# Patient Record
Sex: Female | Born: 1955 | Race: White | Hispanic: No | Smoking: Never smoker
Health system: Southern US, Community
[De-identification: ages and names within clinical notes are randomized; demographics above are authoritative.]

## PROBLEM LIST (undated history)

## (undated) DIAGNOSIS — T7840XA Allergy, unspecified, initial encounter: Secondary | ICD-10-CM

## (undated) DIAGNOSIS — R011 Cardiac murmur, unspecified: Secondary | ICD-10-CM

## (undated) DIAGNOSIS — E785 Hyperlipidemia, unspecified: Secondary | ICD-10-CM

## (undated) HISTORY — DX: Cardiac murmur, unspecified: R01.1

## (undated) HISTORY — PX: BREAST BIOPSY: SHX20

## (undated) HISTORY — DX: Hyperlipidemia, unspecified: E78.5

## (undated) HISTORY — PX: COLONOSCOPY: SHX174

## (undated) HISTORY — DX: Allergy, unspecified, initial encounter: T78.40XA

---

## 1999-06-21 ENCOUNTER — Other Ambulatory Visit: Admission: RE | Admit: 1999-06-21 | Discharge: 1999-06-21 | Payer: Self-pay | Admitting: Obstetrics and Gynecology

## 2000-09-04 ENCOUNTER — Other Ambulatory Visit: Admission: RE | Admit: 2000-09-04 | Discharge: 2000-09-04 | Payer: Self-pay | Admitting: Obstetrics and Gynecology

## 2001-09-17 ENCOUNTER — Other Ambulatory Visit: Admission: RE | Admit: 2001-09-17 | Discharge: 2001-09-17 | Payer: Self-pay | Admitting: Obstetrics and Gynecology

## 2002-09-29 ENCOUNTER — Other Ambulatory Visit: Admission: RE | Admit: 2002-09-29 | Discharge: 2002-09-29 | Payer: Self-pay | Admitting: Obstetrics and Gynecology

## 2003-10-13 ENCOUNTER — Other Ambulatory Visit: Admission: RE | Admit: 2003-10-13 | Discharge: 2003-10-13 | Payer: Self-pay | Admitting: Obstetrics and Gynecology

## 2004-11-22 ENCOUNTER — Other Ambulatory Visit: Admission: RE | Admit: 2004-11-22 | Discharge: 2004-11-22 | Payer: Self-pay | Admitting: Obstetrics and Gynecology

## 2009-04-16 ENCOUNTER — Encounter (INDEPENDENT_AMBULATORY_CARE_PROVIDER_SITE_OTHER): Payer: Self-pay

## 2009-04-20 ENCOUNTER — Ambulatory Visit: Payer: Self-pay | Admitting: Internal Medicine

## 2009-05-03 ENCOUNTER — Ambulatory Visit: Payer: Self-pay | Admitting: Internal Medicine

## 2010-05-05 NOTE — Procedures (Signed)
Summary: Colonoscopy  Patient: [name] Note: All result statuses are Final unless otherwise noted.  Tests: (1) Colonoscopy (COL)   COL Colonoscopy           DONE     [facility]     [address]     [place], [place]  [address]           COLONOSCOPY PROCEDURE REPORT           PATIENT:  Melissa Montoya, Melissa Montoya  MR#:  [id number]     BIRTHDATE:  Aug 11, 1955, 53 yrs. old  GENDER:  female           ENDOSCOPIST:  Wilhemina Bonito. Eda Keys, MD     Referred by:  Richardean Chimera, M.D.           PROCEDURE DATE:  05/03/2009     PROCEDURE:  Average-risk screening colonoscopy     G0121     ASA CLASS:  Class I     INDICATIONS:  Routine Risk Screening           MEDICATIONS:   Fentanyl 75 mcg IV, Versed 9 mg IV           DESCRIPTION OF PROCEDURE:   After the risks benefits and     alternatives of the procedure were thoroughly explained, informed     consent was obtained.  Digital rectal exam was performed and     revealed no abnormalities.   The LB CF-[id number] endoscope     was introduced through the anus and advanced to the cecum, which     was identified by both the appendix and ileocecal valve, without     limitations.TIME TO CECUM = 4:22MIN.The quality of the prep was     excellent, using MoviPrep.  The instrument was then slowly     withdrawn (TIME = 10:37 MIN) as the colon was fully examined.     <<PROCEDUREIMAGES>>           FINDINGS:  A normal appearing cecum, ileocecal valve, and     appendiceal orifice were identified. The ascending, hepatic     flexure, transverse, splenic flexure, descending, sigmoid colon,     and rectum appeared unremarkable.  No polyps or cancers were seen.     Retroflexed views in the rectum revealed no abnormalities.    The     scope was then withdrawn from the patient and the procedure     completed.           COMPLICATIONS:  None           ENDOSCOPIC IMPRESSION:     1) Normal colon     2) No polyps or cancers     RECOMMENDATIONS:     1) Continue current  colorectal screening recommendations for     "routine risk" patients with a repeat colonoscopy in 10 years.           ______________________________     Wilhemina Bonito. Eda Keys, MD           CC:  Richardean Chimera, MD; The Patient           n.     eSIGNED:   Wilhemina Bonito. Eda Keys at 05/03/2009 09:34 AM           [place], Dondra Spry, [id number]  Note: An exclamation mark (!) indicates a result that was not dispersed into the flowsheet. Document Creation Date: 05/03/2009 9:34 AM _______________________________________________________________________  (1) Order result status:  Final Collection or observation date-time: 05/03/2009 09:27 Requested date-time:  Receipt date-time:  Reported date-time:  Referring Physician:   Ordering Physician: Fransico Setters [phone]) Specimen Source:  Source: Launa Grill Order Number: [id number] Lab site:   Appended Document: Colonoscopy    Clinical Lists Changes  Observations: Added new observation of COLONNXTDUE: 04/2019 (05/03/2009 12:28)

## 2010-05-05 NOTE — Miscellaneous (Signed)
Summary: Lec previsit  Clinical Lists Changes  Medications: Added new medication of MOVIPREP 100 GM  SOLR (PEG-KCL-NACL-NASULF-NA ASC-C) As per prep instructions. - Signed Rx of MOVIPREP 100 GM  SOLR (PEG-KCL-NACL-NASULF-NA ASC-C) As per prep instructions.;  #1 x 0;  Signed;  Entered by: Ulis Rias RN;  Authorized by: Hilarie Fredrickson MD;  Method used: Electronically to [facility] [phone]*, [address] [place], [place], [place]  [address], Ph: [phone], Fax: [phone] Observations: Added new observation of NKA: T (04/20/2009 7:56)    Prescriptions: MOVIPREP 100 GM  SOLR (PEG-KCL-NACL-NASULF-NA ASC-C) As per prep instructions.  #1 x 0   Entered by:   Ulis Rias RN   Authorized by:   Hilarie Fredrickson MD   Signed by:   Ulis Rias RN on 04/20/2009   Method used:   [facility]ronically to        [facility] [phone]* (retail)       [address]       [place], [place]  [address]       Ph: [phone]       Fax: [phone]   RxID:   [id number]

## 2010-05-05 NOTE — Letter (Signed)
Summary: [facility] Instructions  [facility]  [address] [place], [place] [address]   Phone: [phone]  Fax: [phone]       Melissa Montoya    September 13, 1955    MRN: [id number]        Procedure Day /Date:  Monday 04/05/2009     Arrival Time:  8:00 am      Procedure Time:  9:00 am     Location of Procedure:                    _x _  [facility] (4th Floor)                         PREPARATION FOR COLONOSCOPY WITH MOVIPREP   Starting 5 days prior to your procedure Wednesday 1/26 do not eat nuts, seeds, popcorn, corn, beans, peas,  salads, or any raw vegetables.  Do not take any fiber supplements (e.g. Metamucil, Citrucel, and Benefiber).  THE DAY BEFORE YOUR PROCEDURE         DATE: Sunday 1/30 1.  Drink clear liquids the entire day-NO SOLID FOOD  2.  Do not drink anything colored red or purple.  Avoid juices with pulp.  No orange juice.  3.  Drink at least 64 oz. (8 glasses) of fluid/clear liquids during the day to prevent dehydration and help the prep work efficiently.  CLEAR LIQUIDS INCLUDE: Water Jello Ice Popsicles Tea (sugar ok, no milk/cream) Powdered fruit flavored drinks Coffee (sugar ok, no milk/cream) Gatorade Juice: apple, white grape, white cranberry  Lemonade Clear bullion, consomm, broth Carbonated beverages (any kind) Strained chicken noodle soup Hard Candy                             4.  In the morning, mix first dose of MoviPrep solution:    Empty 1 Pouch A and 1 Pouch B into the disposable container    Add lukewarm drinking water to the top line of the container. Mix to dissolve    Refrigerate (mixed solution should be used within 24 hrs)  5.  Begin drinking the prep at 5:00 p.m. The MoviPrep container is divided by 4 marks.   Every 15 minutes drink the solution down to the next mark (approximately 8 oz) until the full liter is complete.   6.  Follow completed prep with 16 oz of clear liquid of your choice (Nothing red  or purple).  Continue to drink clear liquids until bedtime.  7.  Before going to bed, mix second dose of MoviPrep solution:    Empty 1 Pouch A and 1 Pouch B into the disposable container    Add lukewarm drinking water to the top line of the container. Mix to dissolve    Refrigerate  THE DAY OF YOUR PROCEDURE      DATE: Monday 1/31  Beginning at 4:00 am (5 hours before procedure):         1. Every 15 minutes, drink the solution down to the next mark (approx 8 oz) until the full liter is complete.  2. Follow completed prep with 16 oz. of clear liquid of your choice.    3. You may drink clear liquids until 7:00 am  (2 HOURS BEFORE PROCEDURE).   MEDICATION INSTRUCTIONS  Unless otherwise instructed, you should take regular prescription medications with a small sip of water   as early as possible  the morning of your procedure.          OTHER INSTRUCTIONS  You will need a responsible adult at least 53 years of age to accompany you and drive you home.   This person must remain in the waiting room during your procedure.  Wear loose fitting clothing that is easily removed.  Leave jewelry and other valuables at home.  However, you may wish to bring a book to read or  an iPod/MP3 player to listen to music as you wait for your procedure to start.  Remove all body piercing jewelry and leave at home.  Total time from sign-in until discharge is approximately 2-3 hours.  You should go home directly after your procedure and rest.  You can resume normal activities the  day after your procedure.  The day of your procedure you should not:   Drive   Make legal decisions   Operate machinery   Drink alcohol   Return to work  You will receive specific instructions about eating, activities and medications before you leave.    The above instructions have been reviewed and explained to me by   Ulis Rias RN  April 20, 2009 8:08 AM     I fully understand and can verbalize  these instructions _____________________________ Date _________

## 2015-01-17 ENCOUNTER — Emergency Department (HOSPITAL_BASED_OUTPATIENT_CLINIC_OR_DEPARTMENT_OTHER)
Admission: EM | Admit: 2015-01-17 | Discharge: 2015-01-17 | Disposition: A | Discharge status: Home / Self Care | Payer: 59 | Attending: Emergency Medicine | Admitting: Emergency Medicine

## 2015-01-17 ENCOUNTER — Encounter (HOSPITAL_BASED_OUTPATIENT_CLINIC_OR_DEPARTMENT_OTHER): Payer: Self-pay | Admitting: Emergency Medicine

## 2015-01-17 DIAGNOSIS — Y9389 Activity, other specified: Secondary | ICD-10-CM | POA: Diagnosis not present

## 2015-01-17 DIAGNOSIS — L03114 Cellulitis of left upper limb: Secondary | ICD-10-CM | POA: Diagnosis not present

## 2015-01-17 DIAGNOSIS — W5501XA Bitten by cat, initial encounter: Secondary | ICD-10-CM | POA: Insufficient documentation

## 2015-01-17 DIAGNOSIS — S61432A Puncture wound without foreign body of left hand, initial encounter: Secondary | ICD-10-CM | POA: Insufficient documentation

## 2015-01-17 DIAGNOSIS — Y998 Other external cause status: Secondary | ICD-10-CM | POA: Insufficient documentation

## 2015-01-17 DIAGNOSIS — Y92094 Garage of other non-institutional residence as the place of occurrence of the external cause: Secondary | ICD-10-CM | POA: Insufficient documentation

## 2015-01-17 DIAGNOSIS — Z23 Encounter for immunization: Secondary | ICD-10-CM | POA: Insufficient documentation

## 2015-01-17 DIAGNOSIS — S61452A Open bite of left hand, initial encounter: Secondary | ICD-10-CM

## 2015-01-17 MED ORDER — RABIES IMMUNE GLOBULIN 150 UNIT/ML IM INJ: 20.0000 [IU]/kg | INJECTION | Freq: Once | INTRAMUSCULAR | Status: DC

## 2015-01-17 MED ORDER — AMOXICILLIN-POT CLAVULANATE 875-125 MG PO TABS: 1.0000 | ORAL_TABLET | Freq: Two times a day (BID) | ORAL | Status: DC

## 2015-01-17 MED ORDER — LIDOCAINE-EPINEPHRINE-TETRACAINE (LET) SOLUTION: 3.0000 mL | Freq: Once | NASAL | Status: DC

## 2015-01-17 MED ORDER — TETANUS-DIPHTH-ACELL PERTUSSIS 5-2.5-18.5 LF-MCG/0.5 IM SUSP
0.5000 mL | Freq: Once | INTRAMUSCULAR | Status: AC
  Administered 2015-01-17: 0.5 mL via INTRAMUSCULAR
  Filled 2015-01-17: qty 0.5

## 2015-01-17 MED ORDER — RABIES VACCINE, PCEC IM SUSR: 1.0000 mL | Freq: Once | INTRAMUSCULAR | Status: DC

## 2015-01-17 MED ORDER — AMOXICILLIN-POT CLAVULANATE 875-125 MG PO TABS
1.0000 | ORAL_TABLET | Freq: Once | ORAL | Status: AC
  Administered 2015-01-17: 1 via ORAL
  Filled 2015-01-17: qty 1

## 2015-01-17 NOTE — ED Provider Notes (Signed)
CSN: [id number]     Arrival date & time 01/17/15  1646 History   First MD Initiated Contact with Patient 01/17/15 1657     Chief Complaint  Patient presents with  . Animal Bite  . Cellulitis     (Consider location/radiation/quality/duration/timing/severity/associated sxs/prior Treatment) HPI   Blood pressure 121/76, pulse 70, temperature 98.1 F (36.7 C), temperature source Oral, resp. rate 20, height 5\' 6"  (1.676 m), weight 127 lb (57.607 kg), SpO2 99 %.  Melissa Montoya is a 59 y.o. female complaining of pain and swelling to left hand after being bitten by stray cat yesterday. Patient states she was in her garage, she had around cats with her straight Came in initially the cath was eating friendly, she tried to separate her pets from the stray of the cat bit her. She has increasing pain and swelling to the left hand onset this morning. She rates it at 5 out of 10. She denies fever, chills, nausea, vomiting. Last tetanus shot is unknown. Cat is unavailable for testing. Patient states that the was not acting abnormally aggressive.   History reviewed. No pertinent past medical history. History reviewed. No pertinent past surgical history. History reviewed. No pertinent family history. Social History  Substance Use Topics  . Smoking status: Never Smoker   . Smokeless tobacco: None  . Alcohol Use: Yes   OB History    No data available     Review of Systems  10 systems reviewed and found to be negative, except as noted in the HPI.  Allergies  Review of patient's allergies indicates no known allergies.  Home Medications   Prior to Admission medications   Not on File   BP 121/76 mmHg  Pulse 70  Temp(Src) 98.1 F (36.7 C) (Oral)  Resp 20  Ht 5\' 6"  (1.676 m)  Wt 127 lb (57.607 kg)  BMI 20.51 kg/m2  SpO2 99% Physical Exam  Constitutional: She is oriented to person, place, and time. She appears well-developed and well-nourished. No distress.  HENT:  Head: Normocephalic.    Eyes: Conjunctivae and EOM are normal.  Cardiovascular: Normal rate.   Pulmonary/Chest: Effort normal. No stridor.  Musculoskeletal: Normal range of motion.  Neurological: She is alert and oriented to person, place, and time.  Skin:  Swelling and area of cellulitis to left hand thenar eminence. Patient has puncture wounds to dorsum and volar side consistent with bite. Neurovascularly intact.  Psychiatric: She has a normal mood and affect.  Nursing note and vitals reviewed.           ED Course  Procedures (including critical care time) Labs Review Labs Reviewed - No data to display  Imaging Review No results found. I have personally reviewed and evaluated these images and lab results as part of my medical decision-making.   EKG Interpretation None      MDM   Final diagnoses:  Cat bite of hand, left, initial encounter  Cellulitis of left upper extremity    Filed Vitals:   01/17/15 1652 01/17/15 1654 01/17/15 1756  BP:  121/76 144/77  Pulse: 70  70  Temp: 98.1 F (36.7 C)  97.9 F (36.6 C)  TempSrc: Oral  Oral  Resp: 20  18  Height: 5\' 6"  (1.676 m)    Weight: 127 lb (57.607 kg)    SpO2: 99%  100%    Medications  Tdap (BOOSTRIX) injection 0.5 mL (0.5 mLs Intramuscular Given 01/17/15 1710)  amoxicillin-clavulanate (AUGMENTIN) 875-125 MG per tablet 1 tablet (1  tablet Oral Given 01/17/15 1709)    [name] is 59 y.o. female presenting with puncture wound and cellulitis secondary to Bite occurring yesterday. Tetanus is updated and patient is started on Augmentin. Recommend rabies vaccination as animals not available for testing. Patient declines and states she will try to locate the animal. Patient given animal control contact information. Advised her were to return to the ED tomorrow for recheck. Area of cellulitis is outlined we've had an extensive discussion of return precautions. Have advised this patient if she changes her mind about the rabies vaccination  she is welcome to return to the ED.    Evaluation does not show pathology that would require ongoing emergent intervention or inpatient treatment. Pt is hemodynamically stable and mentating appropriately. Discussed findings and plan with patient/guardian, who agrees with care plan. All questions answered. Return precautions discussed and outpatient follow up given.   Discharge Medication List as of 01/17/2015  6:14 PM    START taking these medications   Details  amoxicillin-clavulanate (AUGMENTIN) 875-125 MG tablet Take 1 tablet by mouth 2 (two) times daily. One tab po bid x 10 days, Starting 01/17/2015, Until Discontinued, Print           Monico Blitz, PA-C 01/18/15 0010  Ezequiel Essex, MD 01/18/15 Laureen Abrahams

## 2015-01-17 NOTE — ED Notes (Signed)
Wound erythema marked with skin marker

## 2015-01-17 NOTE — Discharge Instructions (Signed)
Please return to the emergency room or your primary care physician for a wound recheck in 24 hours. If you cannot find the cat please return to [name] to begin rabies vaccination series  Please follow with your primary care doctor in the next 2 days for a check-up. They must obtain records for further management.   Do not hesitate to return to the Emergency Department for any new, worsening or concerning symptoms.    Cellulitis Cellulitis is an infection of the skin and the tissue beneath it. The infected area is usually red and tender. Cellulitis occurs most often in the arms and lower legs.  CAUSES  Cellulitis is caused by bacteria that enter the skin through cracks or cuts in the skin. The most common types of bacteria that cause cellulitis are staphylococci and streptococci. SIGNS AND SYMPTOMS   Redness and warmth.  Swelling.  Tenderness or pain.  Fever. DIAGNOSIS  Your health care provider can usually determine what is wrong based on a physical exam. Blood tests may also be done. TREATMENT  Treatment usually involves taking an antibiotic medicine. HOME CARE INSTRUCTIONS   Take your antibiotic medicine as directed by your health care provider. Finish the antibiotic even if you start to feel better.  Keep the infected arm or leg elevated to reduce swelling.  Apply a warm cloth to the affected area up to 4 times per day to relieve pain.  Take medicines only as directed by your health care provider.  Keep all follow-up visits as directed by your health care provider. SEEK MEDICAL CARE IF:   You notice red streaks coming from the infected area.  Your red area gets larger or turns dark in color.  Your bone or joint underneath the infected area becomes painful after the skin has healed.  Your infection returns in the same area or another area.  You notice a swollen bump in the infected area.  You develop new symptoms.  You have a fever. SEEK  IMMEDIATE MEDICAL CARE IF:   You feel very sleepy.  You develop vomiting or diarrhea.  You have a general ill feeling (malaise) with muscle aches and pains.   This information is not intended to replace advice given to you by your health care provider. Make sure you discuss any questions you have with your health care provider.   Document Released: 12/28/2004 Document Revised: 12/09/2014 Document Reviewed: 06/05/2011 Elsevier Interactive Patient Education [employer].

## 2015-01-17 NOTE — ED Notes (Signed)
PA at bedside.

## 2015-01-17 NOTE — ED Notes (Signed)
Pt in c/o cat bite to L hand yesterday, pt now has edema, erythema, and singular red streak noted to be travelling proximally up the L forearm.

## 2015-01-17 NOTE — ED Notes (Signed)
EDPA Elmyra Ricks discussed recommendation for rabies vaccine with pt. Pt wishes to attempt to locate cat and follow up with [name] if she decides to have to rabies series. Advised pt to contact animal control to assist her with finding the cat

## 2015-01-18 ENCOUNTER — Encounter (HOSPITAL_BASED_OUTPATIENT_CLINIC_OR_DEPARTMENT_OTHER): Payer: Self-pay | Admitting: Emergency Medicine

## 2015-01-18 ENCOUNTER — Emergency Department (HOSPITAL_BASED_OUTPATIENT_CLINIC_OR_DEPARTMENT_OTHER)
Admission: EM | Admit: 2015-01-18 | Discharge: 2015-01-18 | Disposition: A | Discharge status: Home / Self Care | Payer: 59 | Attending: Emergency Medicine | Admitting: Emergency Medicine

## 2015-01-18 DIAGNOSIS — W5501XD Bitten by cat, subsequent encounter: Secondary | ICD-10-CM | POA: Insufficient documentation

## 2015-01-18 DIAGNOSIS — Z4800 Encounter for change or removal of nonsurgical wound dressing: Secondary | ICD-10-CM | POA: Diagnosis present

## 2015-01-18 DIAGNOSIS — S61452D Open bite of left hand, subsequent encounter: Secondary | ICD-10-CM

## 2015-01-18 NOTE — Discharge Instructions (Signed)
Return here as needed.  Follow-up with your primary doctor.  Keep area clean and dry

## 2015-01-18 NOTE — ED Notes (Signed)
Patient reports that her redness is looking a lot better. Skin marker edges intact no redness out side of the sites

## 2015-01-18 NOTE — ED Provider Notes (Signed)
CSN: [id number]     Arrival date & time 01/18/15  1645 History   First MD Initiated Contact with Patient 01/18/15 1853     Chief Complaint  Patient presents with  . Wound Check     (Consider location/radiation/quality/duration/timing/severity/associated sxs/prior Treatment) HPI Patient presents to the emergency department with a recheck of a cat bite to her hand.  The patient was seen yesterday and advised to return here for recheck.  The area was outlined that was previously erythematous.  Patient states the swelling and redness seemed to improved.  She has not noticed any drainage from the bite History reviewed. No pertinent past medical history. History reviewed. No pertinent past surgical history. History reviewed. No pertinent family history. Social History  Substance Use Topics  . Smoking status: Never Smoker   . Smokeless tobacco: None  . Alcohol Use: Yes   OB History    No data available     Review of Systems  All other systems negative except as documented in the HPI. All pertinent positives and negatives as reviewed in the HPI.  Allergies  Review of patient's allergies indicates no known allergies.  Home Medications   Prior to Admission medications   Medication Sig Start Date End Date Taking? Authorizing Provider  amoxicillin-clavulanate (AUGMENTIN) 875-125 MG tablet Take 1 tablet by mouth 2 (two) times daily. One tab po bid x 10 days 01/17/15   Elmyra Ricks Pisciotta, PA-C   BP 125/72 mmHg  Pulse 64  Temp(Src) 98.3 F (36.8 C) (Oral)  Resp 16  Ht 5\' 6"  (1.676 m)  Wt 127 lb (57.607 kg)  BMI 20.51 kg/m2  SpO2 100% Physical Exam  Constitutional: She appears well-developed and well-nourished. No distress.  HENT:  Head: Normocephalic and atraumatic.  Eyes: Pupils are equal, round, and reactive to light.  Pulmonary/Chest: Effort normal.  Skin:       ED Course  Procedures (including critical care time) Labs Review Labs Reviewed - No data to  display  Imaging Review No results found. I have personally reviewed and evaluated these images and lab results as part of my medical decision-making. Based on the patient's report of decreased swelling and redness along with the appearance of the area.  There is no signs of significant deep space infection of the hand.  Patient is advised to monitor the area.  Keep the area clean and dry.  Told to return here as needed    Dalia Heading, PA-C 14/48/18 5631  David Glick, MD 49/70/26 [id number]

## 2015-01-19 ENCOUNTER — Emergency Department (HOSPITAL_COMMUNITY)
Admission: EM | Admit: 2015-01-19 | Discharge: 2015-01-19 | Disposition: A | Discharge status: Home / Self Care | Payer: 59 | Attending: Emergency Medicine | Admitting: Emergency Medicine

## 2015-01-19 ENCOUNTER — Encounter (HOSPITAL_COMMUNITY): Payer: Self-pay

## 2015-01-19 DIAGNOSIS — S61432D Puncture wound without foreign body of left hand, subsequent encounter: Secondary | ICD-10-CM | POA: Diagnosis not present

## 2015-01-19 DIAGNOSIS — W5501XD Bitten by cat, subsequent encounter: Secondary | ICD-10-CM | POA: Insufficient documentation

## 2015-01-19 DIAGNOSIS — Z23 Encounter for immunization: Secondary | ICD-10-CM | POA: Insufficient documentation

## 2015-01-19 MED ORDER — RABIES VACCINE, PCEC IM SUSR
1.0000 mL | Freq: Once | INTRAMUSCULAR | Status: AC
  Administered 2015-01-19: 1 mL via INTRAMUSCULAR
  Filled 2015-01-19: qty 1

## 2015-01-19 MED ORDER — RABIES IMMUNE GLOBULIN 150 UNIT/ML IM INJ
20.0000 [IU]/kg | INJECTION | Freq: Once | INTRAMUSCULAR | Status: AC
  Administered 2015-01-19: 1125 [IU]
  Filled 2015-01-19: qty 8

## 2015-01-19 NOTE — ED Notes (Signed)
Pt reports being bit by outdoor cat on Saturday. Pt had infection to area and is currently being tx with amoxicillin. Infection improving but pt states she found out cat hasn't been vaccinated. Pt requesting rabies vaccination.

## 2015-01-19 NOTE — Discharge Instructions (Signed)
Rabies °Rabies is a viral infection that can be spread to people from infected animals. The infection affects the brain and central nervous system. Once the disease develops, it almost always causes death. Because of this, when a person is bitten by an animal that may have rabies, treatment to prevent rabies often needs to be started whether or not the animal is known to be infected. Prompt treatment with the rabies vaccine and rabies immune globulin is very effective at preventing the infection from developing in people who have been exposed to the rabies virus. °CAUSES  °Rabies is caused by a virus that lives inside some animals. When a person is bitten by an infected animal, the rabies virus is spread to the person through the infected spit (saliva) of the animal. This virus can be carried by animals such as dogs, cats, skunks, bats, woodchucks, raccoons, coyotes, and foxes. °SYMPTOMS  °By the time symptoms appear, rabies is usually fatal for the person. Common symptoms include: °· Headache. °· Fever. °· Fatigue and weakness. °· Agitation. °· Anxiety. °· Confusion. °· Unusual behavior, such as hyperactivity, fear of water (hydrophobia), or fear of air (aerophobia). °· Hallucinations. °· Insomnia. °· Weakness in the arms or legs. °· Difficulty swallowing. °Most people get sick in 1-3 months after being bitten. This often varies and may depend on the location of the bite. The infection will take less time to develop if the bite occurred closer to the head.  °DIAGNOSIS  °To determine if a person is infected, several tests must be performed, such as: °· A skin biopsy. °· A saliva test. °· A lumbar puncture to remove spinal fluid so it can be examined. °· Blood tests. °TREATMENT  °Treatment to prevent the infection from developing (post-exposure prophylaxis, PEP) is often started before knowing for sure if the person has been exposed to the rabies virus. PEP involves cleaning the wound, giving an antibody injection  (rabies immune globulin), and giving a series of rabies vaccine injections. The series of injections are usually given over a two-week period. If possible, the animal that bit the person will be observed to see if it remains healthy. If the animal has been killed, it can be sent to a state laboratory and examined to see if the animal had rabies. °If a person is bitten by a domestic animal (dog, cat, or ferret) that appears healthy and can be observed to see if it remains healthy, often no further treatment is necessary other than care of the wounds caused by the animal. °Rabies is often a fatal illness once the infection develops in a person. Although a few people who developed rabies have survived after experimental treatment with certain drugs, all these survivors still had severe nervous system problems after the treatment. This is why caregivers use extra caution and begin PEP treatment for people who have been bitten by animals that are possibly infected with rabies.  °HOME CARE INSTRUCTIONS  °If you were bitten by an unknown animal, make sure you know your caregiver's instructions for follow-up. If the animal was sent to a laboratory for examination, ask when the test results will be ready. Make sure you get the test results.  °Take these steps to care for your wound: °· Keep the wound clean, dry, and dressed as directed by your caregiver. °· Keep the injured part elevated as much as possible. °· Do not resume use of the affected area until directed. °· Only take over-the-counter or prescription medicines as directed by your   caregiver. °· Keep all follow-up appointments as directed by your caregiver. °PREVENTION  °To prevent rabies, people need to reduce their risk of having contact with infected animals.  °· Make sure your pets (dogs, cats, ferrets) are vaccinated against rabies. Keep these vaccinations up-to-date as directed by your veterinarian. °· Supervise your pets when they are outside. Keep them away  from wild animals. °· Call your local animal control services to report any stray animals. These animals may not be vaccinated. °· Stay away from stray or wild animals. °· Consider getting the rabies vaccine (preexposure) if you are traveling to an area where rabies is common or if your job or activities involve possible contact with wild or stray animals. Discuss this with your caregiver. °  °This information is not intended to replace advice given to you by your health care provider. Make sure you discuss any questions you have with your health care provider. °  °Document Released: 03/20/2005 Document Revised: 04/10/2014 Document Reviewed: 10/17/2011 °Elsevier Interactive Patient Education ©2016 Elsevier Inc. ° °

## 2015-01-19 NOTE — ED Notes (Signed)
Rabies sheet explained to pt and faxed to [facility] and pharmacy.

## 2015-01-22 ENCOUNTER — Telehealth: Payer: Self-pay

## 2015-01-22 ENCOUNTER — Emergency Department (HOSPITAL_COMMUNITY)
Admission: EM | Admit: 2015-01-22 | Discharge: 2015-01-22 | Disposition: A | Discharge status: Home / Self Care | Payer: 59 | Source: Home / Self Care

## 2015-01-22 ENCOUNTER — Encounter (HOSPITAL_COMMUNITY): Payer: Self-pay | Admitting: *Deleted

## 2015-01-22 DIAGNOSIS — Z203 Contact with and (suspected) exposure to rabies: Secondary | ICD-10-CM

## 2015-01-22 MED ORDER — RABIES VACCINE, PCEC IM SUSR
INTRAMUSCULAR | Status: AC
  Filled 2015-01-22: qty 1

## 2015-01-22 MED ORDER — RABIES VACCINE, PCEC IM SUSR
1.0000 mL | Freq: Once | INTRAMUSCULAR | Status: AC
  Administered 2015-01-22: 1 mL via INTRAMUSCULAR

## 2015-01-22 NOTE — ED Notes (Signed)
Pt  Here  For  The  Next  In rabies   Shots     She  Reports  The  Bite  Is  Much better       She  Is  Taking  Her  Anti   Biotics

## 2015-01-22 NOTE — Discharge Instructions (Signed)
Return  As  Directed    For  The next  In  Series      Sooner  If  worse

## 2015-01-22 NOTE — Telephone Encounter (Signed)
error 

## 2015-01-26 ENCOUNTER — Emergency Department (HOSPITAL_COMMUNITY)
Admission: EM | Admit: 2015-01-26 | Discharge: 2015-01-26 | Disposition: A | Discharge status: Home / Self Care | Payer: 59 | Source: Home / Self Care

## 2015-01-26 ENCOUNTER — Encounter (HOSPITAL_COMMUNITY): Payer: Self-pay

## 2015-01-26 DIAGNOSIS — Z203 Contact with and (suspected) exposure to rabies: Secondary | ICD-10-CM

## 2015-01-26 MED ORDER — RABIES VACCINE, PCEC IM SUSR
1.0000 mL | Freq: Once | INTRAMUSCULAR | Status: AC
  Administered 2015-01-26: 1 mL via INTRAMUSCULAR

## 2015-01-26 MED ORDER — RABIES VACCINE, PCEC IM SUSR
INTRAMUSCULAR | Status: AC
  Filled 2015-01-26: qty 1

## 2015-01-26 NOTE — ED Notes (Addendum)
Day #7 , shot #3 of rabies shots, started at [facility]; denies issues w bite site

## 2015-02-02 ENCOUNTER — Encounter (HOSPITAL_COMMUNITY): Payer: Self-pay

## 2015-02-02 ENCOUNTER — Emergency Department (HOSPITAL_COMMUNITY)
Admission: EM | Admit: 2015-02-02 | Discharge: 2015-02-02 | Disposition: A | Discharge status: Home / Self Care | Payer: 59 | Source: Home / Self Care

## 2015-02-02 DIAGNOSIS — Z203 Contact with and (suspected) exposure to rabies: Secondary | ICD-10-CM

## 2015-02-02 MED ORDER — RABIES VACCINE, PCEC IM SUSR
1.0000 mL | Freq: Once | INTRAMUSCULAR | Status: AC
  Administered 2015-02-02: 1 mL via INTRAMUSCULAR

## 2015-02-02 MED ORDER — RABIES VACCINE, PCEC IM SUSR
INTRAMUSCULAR | Status: AC
  Filled 2015-02-02: qty 1

## 2015-02-02 NOTE — ED Notes (Signed)
Day #14 rabies series

## 2015-02-25 NOTE — ED Provider Notes (Signed)
CSN: [id number]     Arrival date & time 01/19/15  1214 History   First MD Initiated Contact with Patient 01/19/15 1309     Chief Complaint  Patient presents with  . Rabies Injection     (Consider location/radiation/quality/duration/timing/severity/associated sxs/prior Treatment) The history is provided by the patient. No language interpreter was used.  Patient present to the ED with concern for rabies prophylaxis. She has been seen twice for cat bite to the hand which is improving since her initial visit. She spoke with the cat's primary owner and the cat is not UTD on rabies. She is requesting rabies prophylaxis.   History reviewed. No pertinent past medical history. History reviewed. No pertinent past surgical history. No family history on file. Social History  Substance Use Topics  . Smoking status: Never Smoker   . Smokeless tobacco: None  . Alcohol Use: Yes   OB History    No data available     Review of Systems Ten systems reviewed and are negative for acute change, except as noted in the HPI.     Allergies  Review of patient's allergies indicates no known allergies.  Home Medications   Prior to Admission medications   Medication Sig Start Date End Date Taking? Authorizing Provider  amoxicillin-clavulanate (AUGMENTIN) 875-125 MG tablet Take 1 tablet by mouth 2 (two) times daily. One tab po bid x 10 days 01/17/15   Elmyra Ricks Pisciotta, PA-C   BP 121/84 mmHg  Pulse 82  Temp(Src) 98.4 F (36.9 C) (Oral)  Resp 14  Ht 5' 6.5" (1.689 m)  Wt 57.607 kg  BMI 20.19 kg/m2  SpO2 100% Physical Exam  Constitutional: She is oriented to person, place, and time. She appears well-developed and well-nourished. No distress.  HENT:  Head: Normocephalic and atraumatic.  Eyes: Conjunctivae are normal. No scleral icterus.  Neck: Normal range of motion.  Cardiovascular: Normal rate, regular rhythm and normal heart sounds.  Exam reveals no gallop and no friction rub.   No murmur  heard. Pulmonary/Chest: Effort normal and breath sounds normal. No respiratory distress.  Abdominal: Soft. Bowel sounds are normal. She exhibits no distension and no mass. There is no tenderness. There is no guarding.  Musculoskeletal:  2 small punctures on the thenar eminence of the left hand. Erythema is nearly resolved away from the line of demarcation.. Minimal tenderness  Neurological: She is alert and oriented to person, place, and time.  Skin: Skin is warm and dry. She is not diaphoretic.  Nursing note and vitals reviewed.   ED Course  Procedures (including critical care time) Labs Review Labs Reviewed - No data to display  Imaging Review No results found. I have personally reviewed and evaluated these images and lab results as part of my medical decision-making.   EKG Interpretation None      MDM   Final diagnoses:  Need for rabies vaccination    Patient wound welll healing. Rabies Immunoglobulin and vaccination given. No immediate reaction  Follow up for rabies series at [facility]. contine with augmentin.  Margarita Mail, PA-C 02/25/15 [place], DO 02/25/15 1406

## 2015-04-16 ENCOUNTER — Encounter: Payer: Self-pay | Admitting: Internal Medicine

## 2016-03-04 ENCOUNTER — Encounter (HOSPITAL_BASED_OUTPATIENT_CLINIC_OR_DEPARTMENT_OTHER): Payer: Self-pay | Admitting: *Deleted

## 2016-03-04 ENCOUNTER — Emergency Department (HOSPITAL_BASED_OUTPATIENT_CLINIC_OR_DEPARTMENT_OTHER)
Admission: EM | Admit: 2016-03-04 | Discharge: 2016-03-04 | Disposition: A | Discharge status: Home / Self Care | Payer: 59 | Attending: Emergency Medicine | Admitting: Emergency Medicine

## 2016-03-04 DIAGNOSIS — L03116 Cellulitis of left lower limb: Secondary | ICD-10-CM

## 2016-03-04 DIAGNOSIS — M25572 Pain in left ankle and joints of left foot: Secondary | ICD-10-CM | POA: Diagnosis present

## 2016-03-04 MED ORDER — IBUPROFEN 200 MG PO TABS
600.0000 mg | ORAL_TABLET | Freq: Once | ORAL | Status: AC
  Administered 2016-03-04: 600 mg via ORAL

## 2016-03-04 MED ORDER — CEPHALEXIN 500 MG PO CAPS: 500.0000 mg | ORAL_CAPSULE | Freq: Four times a day (QID) | ORAL | 0 refills | Status: DC

## 2016-03-04 NOTE — Discharge Instructions (Signed)
You exam is consistent with an infection of the skin (cellulitis). I have prescribed you Keflex to take 4 times per day for 7 days. If your symptoms fail to improve or worsen, please seek care. You can take Ibuprofen or Tylenol for the pain. I suspect your dry skin may have contributed to the infection, try to use a moisturizer regularly.

## 2016-03-04 NOTE — ED Provider Notes (Signed)
[facility] DEPT [facility] Provider Note   CSN: [id number] Arrival date & time: 03/04/16  [id number]     History   Chief Complaint Chief Complaint  Patient presents with  . Ankle Pain    HPI Melissa Montoya is a 60 y.o. female.  HPI  Patient noted left swollen and erythematous ankle yesterday. She notes the pain is in the anterior aspect. She feels it started like shin splints a few days prior but became more concerning once there was swelling and erythema.   She notes pain with ambulation that started today which prompted her to come in.  No fevers, chills, SOB, no long trips, h/o malignancy, prolonged immbolization. Not on any medications.   She's in the process of moving residences since Monday. She doesn't recall an injury. She's never injured the ankle before.  She used iced and elevation with helped some.   PCP- [facility]  History reviewed. No pertinent past medical history.  There are no active problems to display for this patient.   History reviewed. No pertinent surgical history.  OB History    No data available       Home Medications    Prior to Admission medications   Medication Sig Start Date End Date Taking? Authorizing Provider  Fexofenadine HCl (ALLEGRA ALLERGY PO) Take by mouth.   Yes Historical Provider, MD  cephALEXin (KEFLEX) 500 MG capsule Take 1 capsule (500 mg total) by mouth 4 (four) times daily. 03/04/16   Archie Patten, MD    Family History No family history on file.  Social History Social History  Substance Use Topics  . Smoking status: Never Smoker  . Smokeless tobacco: Never Used  . Alcohol use Yes     Comment: daily     Allergies   Patient has no known allergies.   Review of Systems Review of Systems  Constitutional: Positive for activity change. Negative for appetite change, chills, diaphoresis, fatigue and fever.  HENT: Negative.   Eyes: Negative.   Respiratory: Negative for cough, chest tightness, shortness of breath,  wheezing and stridor.   Cardiovascular: Negative for chest pain.  Gastrointestinal: Negative.   Endocrine: Negative.   Genitourinary: Negative.   Musculoskeletal: Positive for arthralgias, gait problem and joint swelling.  Allergic/Immunologic: Negative.   Neurological: Negative for weakness.  Hematological: Negative.   Psychiatric/Behavioral: Negative.      Physical Exam Updated Vital Signs BP 124/71 (BP Location: Right Arm)   Pulse 80   Temp 98.2 F (36.8 C) (Oral)   Resp 18   Ht 5\' 6"  (1.676 m)   Wt 57.6 kg   SpO2 98%   BMI 20.50 kg/m   Physical Exam  Constitutional: She is oriented to person, place, and time. She appears well-developed and well-nourished. No distress.  HENT:  Head: Normocephalic and atraumatic.  Nose: Nose normal.  Mouth/Throat: No oropharyngeal exudate.  Eyes: Conjunctivae are normal. Right eye exhibits no discharge. Left eye exhibits no discharge. No scleral icterus.  Neck: Neck supple.  Cardiovascular: Normal rate, regular rhythm and intact distal pulses.  Exam reveals no gallop and no friction rub.   No murmur heard. Pulmonary/Chest: Effort normal. No respiratory distress. She has no wheezes. She has no rales. She exhibits no tenderness.  Abdominal: Soft. She exhibits no distension.  Musculoskeletal:  Left leg: ankle swollen and erythematous. Erythema and warmth from the ankle up to mid-shin on the anterior aspect. Tenderness to palpation over the anterior shin diffusely, no point tenderness. No drainage or fluctuance noted.  No tenderness over the medial or lateral malleoli. With Homnan's maneuver, pt endorses pain over the anterior aspect of the leg but none in the posterior calf. Pain with both dorsiflexion and plantar flexion. Sensation intact distally. Brisk capillary refill. Equal DP pulses bilaterally On measuring calves, 33cm on the right, 34cm on the left.   Neurological: She is alert and oriented to person, place, and time.  Skin: Skin is  dry. Capillary refill takes less than 2 seconds. She is not diaphoretic. There is erythema.  Warmth and erythema of the left anterior lower extremity as above.  Psychiatric: She has a normal mood and affect.     ED Treatments / Results  Labs (all labs ordered are listed, but only abnormal results are displayed) Labs Reviewed - No data to display  EKG  EKG Interpretation None       Radiology No results found.  Procedures Procedures (including critical care time)  Medications Ordered in ED Medications  ibuprofen (ADVIL,MOTRIN) tablet 600 mg (600 mg Oral Given 03/04/16 0956)     Initial Impression / Assessment and Plan / ED Course  I have reviewed the triage vital signs and the nursing notes.  Pertinent labs & imaging results that were available during my care of the patient were reviewed by me and considered in my medical decision making (see chart for details).  Clinical Course     Final Clinical Impressions(s) / ED Diagnoses   Final diagnoses:  Cellulitis of left lower extremity   This is a previously healthy 60 year old female presenting with pain, swelling, and erythema of the left lower extremity. Exam consistent with cellulitis without purulence. She is well-appearing on exam without systemic symptoms.  Well's criteria for DVT -2 (as an alternative diagnosis is more likely); she is not tachycardic and has no risk factors. Most likely fracture given the lack of point tenderness and the presence of erythema and warmth of the lower external knee. The patient was given a Rx for Keflex 500mg  4x/day and return precautions were discussed. She is stable for discharge and in agreement with the plan.  New Prescriptions New Prescriptions   CEPHALEXIN (KEFLEX) 500 MG CAPSULE    Take 1 capsule (500 mg total) by mouth 4 (four) times daily.     Archie Patten, MD 03/04/16 [place] Liu, MD 03/04/16 2220

## 2016-03-04 NOTE — ED Triage Notes (Signed)
Pt reports L ankle pain and swelling that began yesterday. Reports she's been moving her residence (carrying heaving boxes, walking up and down stairs, etc) this past week. Reports elevation and ice helps pain. Denies numbness/tingling. Able to bear weight on foot.

## 2016-03-04 NOTE — ED Provider Notes (Signed)
I saw and evaluated the patient, reviewed the resident's note and I agree with the findings and plan.   EKG Interpretation None      60 year old female, otherwise healthy, presenting with swelling and redness to right lower leg. In process of moving homes, and has been on her feet more, carrying boxes, packing boxes. No trauma or known injury, but w/ dry skin to lower extremities. Since yesterday, swelling and erythema to the anterior ankle/shin of the left leg. No numbness or weakness. Able to ambulate, but pain worsens with walking. No fever or chills, calf tenderness, dyspnea, chest pain.  Presentation consistent with cellulitis of the left lower anterior shin, with tenderness to palpation, warmth, overlying erythema. Normal ROM of of ankle and no significant ankle swelling. No concerns for septic arthritis. No systemic signs or symptoms of illness. Extremities neurovascularly in tact.  Will treat with course of kelfex and discussed continued supportive care instructions. Strict return and follow-up instructions reviewed. She expressed understanding of all discharge instructions and felt comfortable with the plan of care.    Forde Dandy, MD 03/04/16 1001

## 2016-03-09 ENCOUNTER — Ambulatory Visit
Admission: RE | Admit: 2016-03-09 | Discharge: 2016-03-09 | Disposition: A | Discharge status: Home / Self Care | Payer: 59 | Source: Ambulatory Visit | Attending: Internal Medicine | Admitting: Internal Medicine

## 2016-03-09 ENCOUNTER — Other Ambulatory Visit: Payer: Self-pay | Admitting: Internal Medicine

## 2016-03-09 DIAGNOSIS — L03116 Cellulitis of left lower limb: Secondary | ICD-10-CM

## 2016-03-09 DIAGNOSIS — R600 Localized edema: Secondary | ICD-10-CM

## 2017-01-01 ENCOUNTER — Emergency Department (HOSPITAL_COMMUNITY): Payer: 59

## 2017-01-01 ENCOUNTER — Emergency Department (HOSPITAL_COMMUNITY)
Admission: EM | Admit: 2017-01-01 | Discharge: 2017-01-01 | Disposition: A | Discharge status: Home / Self Care | Payer: 59 | Attending: Emergency Medicine | Admitting: Emergency Medicine

## 2017-01-01 ENCOUNTER — Encounter (HOSPITAL_COMMUNITY): Payer: Self-pay | Admitting: *Deleted

## 2017-01-01 DIAGNOSIS — R079 Chest pain, unspecified: Secondary | ICD-10-CM | POA: Diagnosis not present

## 2017-01-01 DIAGNOSIS — Z79899 Other long term (current) drug therapy: Secondary | ICD-10-CM | POA: Diagnosis not present

## 2017-01-01 LAB — CBC
HCT: 35.4 % — ABNORMAL LOW (ref 36.0–46.0)
Hemoglobin: 11.7 g/dL — ABNORMAL LOW (ref 12.0–15.0)
MCH: 32.6 pg (ref 26.0–34.0)
MCHC: 33.1 g/dL (ref 30.0–36.0)
MCV: 98.6 fL (ref 78.0–100.0)
PLATELETS: 187 10*3/uL (ref 150–400)
RBC: 3.59 MIL/uL — ABNORMAL LOW (ref 3.87–5.11)
RDW: 13.1 % (ref 11.5–15.5)
WBC: 5.8 10*3/uL (ref 4.0–10.5)

## 2017-01-01 LAB — BASIC METABOLIC PANEL
Anion gap: 9 (ref 5–15)
BUN: 11 mg/dL (ref 6–20)
CO2: 27 mmol/L (ref 22–32)
CREATININE: 0.78 mg/dL (ref 0.44–1.00)
Calcium: 9.5 mg/dL (ref 8.9–10.3)
Chloride: 101 mmol/L (ref 101–111)
GFR calc Af Amer: >60 mL/min (ref >60)
GLUCOSE: 139 mg/dL — AB (ref 65–99)
Potassium: 3.9 mmol/L (ref 3.5–5.1)
SODIUM: 137 mmol/L (ref 135–145)

## 2017-01-01 LAB — I-STAT TROPONIN, ED: Troponin i, poc: 0 ng/mL (ref 0.00–0.08)

## 2017-01-01 NOTE — ED Provider Notes (Signed)
[facility] DEPT Provider Note   CSN: [id number] Arrival date & time: 01/01/17  1433     History   Chief Complaint Chief Complaint  Patient presents with  . Chest Pain    HPI Melissa Montoya is a 61 y.o. female.  She presents for evaluation of chest pain, sharp in nature, radiating to her mid back, present for 3 days and improving.  She relates it as starting while she was taking an anti-inflammatory for right knee pain after an injury.  She stopped taking the anti-inflammatory now feels better.  She denies diaphoresis, nausea, vomiting, weakness or dizziness.  No prior similar problems.  No history of cardiac disorder.  There are no other known modifying factors.  HPI  History reviewed. No pertinent past medical history.  There are no active problems to display for this patient.   History reviewed. No pertinent surgical history.  OB History    No data available       Home Medications    Prior to Admission medications   Medication Sig Start Date End Date Taking? Authorizing Provider  cephALEXin (KEFLEX) 500 MG capsule Take 1 capsule (500 mg total) by mouth 4 (four) times daily. 03/04/16   Archie Patten, MD  Fexofenadine HCl [facility] ALLERGY PO) Take by mouth.    [provider]    Family History No family history on file.  Social History Social History  Substance Use Topics  . Smoking status: Never Smoker  . Smokeless tobacco: Never Used  . Alcohol use Yes     Comment: daily     Allergies   Patient has no known allergies.   Review of Systems Review of Systems  All other systems reviewed and are negative.    Physical Exam Updated Vital Signs BP 127/82   Pulse 63   Temp 97.9 F (36.6 C) (Oral)   Resp 17   SpO2 97%   Physical Exam  Constitutional: She is oriented to person, place, and time. She appears well-developed and well-nourished. No distress.  HENT:  Head: Normocephalic and atraumatic.  Eyes: Pupils are equal, round, and  reactive to light. Conjunctivae and EOM are normal.  Neck: Normal range of motion and phonation normal. Neck supple.  Cardiovascular: Normal rate and regular rhythm.   Pulmonary/Chest: Effort normal and breath sounds normal. She exhibits no tenderness.  Abdominal: Soft. She exhibits no distension. There is no tenderness. There is no guarding.  Musculoskeletal: Normal range of motion.  Neurological: She is alert and oriented to person, place, and time. She exhibits normal muscle tone.  Skin: Skin is warm and dry.  Psychiatric: She has a normal mood and affect. Her behavior is normal. Judgment and thought content normal.  Nursing note and vitals reviewed.    ED Treatments / Results  Labs (all labs ordered are listed, but only abnormal results are displayed) Labs Reviewed  BASIC METABOLIC PANEL - Abnormal; Notable for the following:       Result Value   Glucose, Bld 139 (*)    All other components within normal limits  CBC - Abnormal; Notable for the following:    RBC 3.59 (*)    Hemoglobin 11.7 (*)    HCT 35.4 (*)    All other components within normal limits  I-STAT TROPONIN, ED    EKG  EKG Interpretation None       Radiology Dg Chest 2 View  Result Date: 01/01/2017 CLINICAL DATA:  Patient with left-sided, central posterior chest pain. EXAM:  CHEST  2 VIEW COMPARISON:  None. FINDINGS: Normal cardiac and mediastinal contours. No consolidative pulmonary opacities. No pleural effusion or pneumothorax. Lower thoracic spine degenerative disc narrowing. IMPRESSION: No acute cardiopulmonary process. Electronically Signed   By: Lovey Newcomer M.D.   On: 01/01/2017 15:57    Procedures Procedures (including critical care time)  Medications Ordered in ED Medications - No data to display   Initial Impression / Assessment and Plan / ED Course  I have reviewed the triage vital signs and the nursing notes.  Pertinent labs & imaging results that were available during my care of the  patient were reviewed by me and considered in my medical decision making (see chart for details).      Patient Vitals for the past 24 hrs:  BP Temp Temp src Pulse Resp SpO2  01/01/17 1730 127/82 - - 63 17 97 %  01/01/17 1439 137/87 97.9 F (36.6 C) Oral 87 18 99 %    5:38 PM Reevaluation with update and discussion. After initial assessment and treatment, an updated evaluation reveals she remains comfortable has no further complaints.  Findings discussed with the patient and all questions were answered. Zondra Lawlor L      Final Clinical Impressions(s) / ED Diagnoses   Final diagnoses:  Nonspecific chest pain   Chest pain radiating to back, consistent with esophagitis associated with use of anti-inflammatory medication.  Doubt ACS, PE or pneumonia.  Nursing Notes Reviewed/ Care Coordinated Applicable Imaging Reviewed Interpretation of Laboratory Data incorporated into ED treatment  The patient appears reasonably screened and/or stabilized for discharge and I doubt any other medical condition or other [facility] requiring further screening, evaluation, or treatment in the ED at this time prior to discharge.  Plan: Home Medications-antacid of choice as needed, avoid anti-inflammatory medication; Home Treatments-rest; return here if the recommended treatment, does not improve the symptoms; Recommended follow up-PCP as needed   New Prescriptions New Prescriptions   No medications on file     Daleen Bo, MD 01/01/17 1740

## 2017-01-01 NOTE — Discharge Instructions (Signed)
The testing today was normal.  There is no sign of cardiac or lung problems today.  Your pain was likely related to the use of a anti-inflammatory medicine.  For persistent symptoms you can try taking an antacid, such as Maalox before meals and at bedtime.  Return here, or see your doctor as needed for problems.

## 2017-01-01 NOTE — ED Triage Notes (Signed)
Pt states chest pain started Saturday morning with pain between shoulder blades, left neck and left arm.  Pt states it has decreased, pt states it is constant but less. No other symptoms.  Pt does have pain with deep breath. No recent travel.

## 2018-03-12 ENCOUNTER — Other Ambulatory Visit: Payer: Self-pay | Admitting: Obstetrics and Gynecology

## 2018-03-12 DIAGNOSIS — Z803 Family history of malignant neoplasm of breast: Secondary | ICD-10-CM

## 2018-03-23 ENCOUNTER — Ambulatory Visit
Admission: RE | Admit: 2018-03-23 | Discharge: 2018-03-23 | Disposition: A | Discharge status: Home / Self Care | Payer: Self-pay | Source: Ambulatory Visit | Attending: Obstetrics and Gynecology | Admitting: Obstetrics and Gynecology

## 2018-03-23 DIAGNOSIS — Z803 Family history of malignant neoplasm of breast: Secondary | ICD-10-CM

## 2018-03-23 MED ORDER — GADOBUTROL 1 MMOL/ML IV SOLN
6.0000 mL | Freq: Once | INTRAVENOUS | Status: AC | PRN
  Administered 2018-03-23: 6 mL via INTRAVENOUS

## 2018-03-28 ENCOUNTER — Other Ambulatory Visit: Payer: Self-pay | Admitting: Obstetrics and Gynecology

## 2018-03-28 DIAGNOSIS — R9389 Abnormal findings on diagnostic imaging of other specified body structures: Secondary | ICD-10-CM

## 2018-04-01 ENCOUNTER — Ambulatory Visit
Admission: RE | Admit: 2018-04-01 | Discharge: 2018-04-01 | Disposition: A | Discharge status: Home / Self Care | Payer: Managed Care, Other (non HMO) | Source: Ambulatory Visit | Attending: Obstetrics and Gynecology | Admitting: Obstetrics and Gynecology

## 2018-04-01 DIAGNOSIS — R9389 Abnormal findings on diagnostic imaging of other specified body structures: Secondary | ICD-10-CM

## 2018-04-01 HISTORY — PX: BREAST BIOPSY: SHX20

## 2018-04-01 MED ORDER — GADOBUTROL 1 MMOL/ML IV SOLN
6.0000 mL | Freq: Once | INTRAVENOUS | Status: AC | PRN
  Administered 2018-04-01: 6 mL via INTRAVENOUS

## 2018-04-02 ENCOUNTER — Other Ambulatory Visit: Payer: Self-pay | Admitting: Obstetrics and Gynecology

## 2018-04-02 DIAGNOSIS — Z803 Family history of malignant neoplasm of breast: Secondary | ICD-10-CM

## 2018-05-07 NOTE — Progress Notes (Signed)
Cardiology Office Note   Date:  05/13/2018   ID:  Melissa Montoya, DOB 1955/08/29, MRN [id number]  PCP:  System, Pcp Not In  Cardiologist:   Coline Calkin [place]ique, MD   Chief Complaint  Patient presents with  . Heart Murmur      History of Present Illness: Melissa Montoya is a 63 y.o. female who is seen at the request of Dr. Arvella Nigh for evaluation of cardiac murmur. She has a history of HLD. Was seen for routine physical and noted to have a heart murmur. This had never been mentioned before. She denies any chest pain, palpitations, dizziness, or dyspnea. No edema. She was sent for an Echo- done at [facility]. This reported to show normal LV size and function. Mild AV thickening and moderate to severe AI. No prior cardiac evaluation.   Past Medical History:  Diagnosis Date  . Heart murmur   . Hyperlipidemia     Past Surgical History:  Procedure Laterality Date  . BREAST BIOPSY       Current Outpatient Medications  Medication Sig Dispense Refill  . cetirizine (ZYRTEC) 10 MG tablet Take 10 mg by mouth daily.     No current facility-administered medications for this visit.     Allergies:   Patient has no known allergies.    Social History:  The patient  reports that she has never smoked. She has never used smokeless tobacco. She reports current alcohol use. She reports that she does not use drugs.   Family History:  The patient's family history includes Breast cancer in her mother; Hyperlipidemia in her sister; Hypertension in her father; Lung cancer in her father.    ROS:  Please see the history of present illness.   Otherwise, review of systems are positive for none.   All other systems are reviewed and negative.    PHYSICAL EXAM: VS:  BP 136/80   Pulse 72   Ht 5\' 6"  (1.676 m)   Wt 129 lb 12.8 oz (58.9 kg)   SpO2 99%   BMI 20.95 kg/m  , BMI Body mass index is 20.95 kg/m. GEN: Well nourished, well developed, in no acute distress  HEENT: normal  Neck: no  JVD, carotid bruits, or masses Cardiac: RRR; there is a soft systolic and diastolic murmur RUSB 1/6, no rubs, or gallops,no edema. No lift Respiratory:  clear to auscultation bilaterally, normal work of breathing GI: soft, nontender, nondistended, + BS MS: no deformity or atrophy  Skin: warm and dry, no rash Neuro:  Strength and sensation are intact Psych: euthymic mood, full affect   EKG:  EKG is ordered today. The ekg ordered today demonstrates NSR with normal Ecg. Rate 72. I have personally reviewed and interpreted this study.    Recent Labs: No results found for requested labs within last 8760 hours.    Lipid Panel No results found for: CHOL, TRIG, HDL, CHOLHDL, VLDL, LDLCALC, LDLDIRECT   Labs dated 08/10/15: cholesterol 246, triglycerides 105, HDL 90, ALT and TSH normal Dated 01/01/17: Hgb 11.7. BMET normal. Dated 10/11/17: A1c 5.2%. cholesterol 249, triglycerides 114, HDL 81, LDL 145. Kidney function normal.  Wt Readings from Last 3 Encounters:  05/13/18 129 lb 12.8 oz (58.9 kg)  03/04/16 127 lb (57.6 kg)  01/19/15 127 lb (57.6 kg)      Other studies Reviewed: Additional studies/ records that were reviewed today include:Echo report from [place] [facility].    ASSESSMENT AND PLAN:  1.  Aortic insufficiency.  Asymptomatic. Based on exam I doubt this is severe. No stigmata of chronic AI. Ecg is normal. LV size and function normal. At this point I have reassured her. Will plan repeat Echo with follow up office visit in one year.  2. Hypercholesterolemia. Recommend dietary modification.    Current medicines are reviewed at length with the patient today.  The patient does not have concerns regarding medicines.  The following changes have been made:  no change  Labs/ tests ordered today include: follow up Echo in one year. No orders of the defined types were placed in this encounter.    Disposition:   FU with me  in 1 year  Signed, Melissa Mynhier Martinique, MD  05/13/2018  11:30 AM    [facility] [address], [place], [place], [address] Phone [phone], Fax [phone]

## 2018-05-08 ENCOUNTER — Telehealth: Payer: Self-pay | Admitting: Cardiology

## 2018-05-08 NOTE — Telephone Encounter (Signed)
Spoke with pt, she was calling to ensure Dr. Martinique has received her ECHO results from Dr. Sherran Needs office. Will route message to Nurse.

## 2018-05-08 NOTE — Telephone Encounter (Signed)
New Message   New Patient wants to make sure her Echo results were faxed over from Dr. Sherran Needs office [phone].

## 2018-05-08 NOTE — Telephone Encounter (Signed)
Spoke to patient she stated she had echo done 12/19.She was calling to see if we received report.Advised I will call Dr.John McComb's office to request records.

## 2018-05-09 ENCOUNTER — Telehealth: Payer: Self-pay

## 2018-05-09 NOTE — Telephone Encounter (Signed)
Sent referral to scheduling and filed notes 

## 2018-05-13 ENCOUNTER — Encounter: Payer: Self-pay | Admitting: Cardiology

## 2018-05-13 ENCOUNTER — Encounter (INDEPENDENT_AMBULATORY_CARE_PROVIDER_SITE_OTHER): Payer: Self-pay

## 2018-05-13 ENCOUNTER — Ambulatory Visit: Payer: Managed Care, Other (non HMO) | Admitting: Cardiology

## 2018-05-13 VITALS — BP 136/80 | HR 72 | Ht 66.0 in | Wt 129.8 lb

## 2018-05-13 DIAGNOSIS — I351 Nonrheumatic aortic (valve) insufficiency: Secondary | ICD-10-CM | POA: Diagnosis not present

## 2018-05-13 NOTE — Patient Instructions (Signed)
Medication Instructions:  Continue same medications If you need a refill on your cardiac medications before your next appointment, please call your pharmacy.   Lab work: None ordered I  Testing/Procedures: Schedule echo in 1 year  Follow-Up: At [facility], you and your health needs are our priority.  As part of our continuing mission to provide you with exceptional heart care, we have created designated Provider Care Teams.  These Care Teams include your primary Cardiologist (physician) and Advanced Practice Providers (APPs -  Physician Assistants and Nurse Practitioners) who all work together to provide you with the care you need, when you need it. . Follow Up with Dr.Jordan in 12 months  Call 3 months before to schedule Schedule echo 1 week before appointment.

## 2018-05-13 NOTE — Addendum Note (Signed)
Addended by: Kathyrn Lass on: 05/13/2018 11:31 AM   Modules accepted: Orders

## 2018-09-23 ENCOUNTER — Other Ambulatory Visit: Payer: Managed Care, Other (non HMO)

## 2018-09-24 ENCOUNTER — Ambulatory Visit
Admission: RE | Admit: 2018-09-24 | Discharge: 2018-09-24 | Disposition: A | Discharge status: Home / Self Care | Payer: Managed Care, Other (non HMO) | Source: Ambulatory Visit | Attending: Obstetrics and Gynecology | Admitting: Obstetrics and Gynecology

## 2018-09-24 ENCOUNTER — Other Ambulatory Visit: Payer: Self-pay

## 2018-09-24 DIAGNOSIS — Z803 Family history of malignant neoplasm of breast: Secondary | ICD-10-CM

## 2018-09-24 MED ORDER — GADOBUTROL 1 MMOL/ML IV SOLN
6.0000 mL | Freq: Once | INTRAVENOUS | Status: AC | PRN
  Administered 2018-09-24: 6 mL via INTRAVENOUS

## 2019-03-19 ENCOUNTER — Other Ambulatory Visit: Payer: Self-pay | Admitting: Obstetrics and Gynecology

## 2019-03-19 DIAGNOSIS — Z1231 Encounter for screening mammogram for malignant neoplasm of breast: Secondary | ICD-10-CM

## 2019-04-29 ENCOUNTER — Telehealth: Payer: Self-pay | Admitting: Cardiology

## 2019-04-29 DIAGNOSIS — I351 Nonrheumatic aortic (valve) insufficiency: Secondary | ICD-10-CM

## 2019-04-29 NOTE — Telephone Encounter (Signed)
Still busy

## 2019-04-29 NOTE — Telephone Encounter (Signed)
Line busy x 4  Order placed for echo

## 2019-04-29 NOTE — Telephone Encounter (Signed)
New Message    Pt is calling and says she needs to have an Echo done before her appt with Dr Martinique     Please call back

## 2019-05-07 ENCOUNTER — Other Ambulatory Visit: Payer: Self-pay

## 2019-05-07 ENCOUNTER — Ambulatory Visit
Admission: RE | Admit: 2019-05-07 | Discharge: 2019-05-07 | Disposition: A | Discharge status: Home / Self Care | Payer: Managed Care, Other (non HMO) | Source: Ambulatory Visit | Attending: Obstetrics and Gynecology | Admitting: Obstetrics and Gynecology

## 2019-05-07 DIAGNOSIS — Z1231 Encounter for screening mammogram for malignant neoplasm of breast: Secondary | ICD-10-CM

## 2019-05-08 ENCOUNTER — Ambulatory Visit (HOSPITAL_COMMUNITY): Payer: Managed Care, Other (non HMO) | Attending: Cardiology

## 2019-05-08 ENCOUNTER — Other Ambulatory Visit: Payer: Self-pay

## 2019-05-08 DIAGNOSIS — I351 Nonrheumatic aortic (valve) insufficiency: Secondary | ICD-10-CM | POA: Diagnosis not present

## 2019-05-09 ENCOUNTER — Other Ambulatory Visit: Payer: Self-pay

## 2019-05-09 DIAGNOSIS — I351 Nonrheumatic aortic (valve) insufficiency: Secondary | ICD-10-CM

## 2019-05-11 NOTE — Progress Notes (Signed)
Cardiology Office Note   Date:  05/13/2019   ID:  Melissa Montoya, DOB 1955/09/09, MRN [id number]  PCP:  Sueanne Margarita, DO  Cardiologist:   Yaniris Braddock Martinique, MD   Chief Complaint  Patient presents with  . Follow-up    12 months.  . Aortic Insuffiency      History of Present Illness: Melissa Montoya is a 64 y.o. female who is seen at the request of Dr. Arvella Nigh for evaluation of cardiac murmur. She has a history of HLD. Was seen for routine physical and noted to have a heart murmur. This had never been mentioned before. She denies any chest pain, palpitations, dizziness, or dyspnea. No edema. She was sent for an Echo- done at [facility]. This reported to show normal LV size and function. Mild AV thickening and moderate to severe AI. No prior cardiac evaluation. On more recent Echo there was moderate AI. LV size, thickness and EF were all normal.   On follow up today she has no complaints. No dyspnea, chest pain, dizziness, palpitations, or edema. Feels very well.    Past Medical History:  Diagnosis Date  . Heart murmur   . Hyperlipidemia     Past Surgical History:  Procedure Laterality Date  . BREAST BIOPSY       Current Outpatient Medications  Medication Sig Dispense Refill  . cetirizine (ZYRTEC) 10 MG tablet Take 10 mg by mouth daily.     No current facility-administered medications for this visit.    Allergies:   Patient has no known allergies.    Social History:  The patient  reports that she has never smoked. She has never used smokeless tobacco. She reports current alcohol use. She reports that she does not use drugs.   Family History:  The patient's family history includes Breast cancer in her mother; Hyperlipidemia in her sister; Hypertension in her father; Lung cancer in her father.    ROS:  Please see the history of present illness.   Otherwise, review of systems are positive for none.   All other systems are reviewed and negative.    PHYSICAL  EXAM: VS:  BP 108/78 (BP Location: Right Arm, Patient Position: Sitting, Cuff Size: Normal)   Pulse 65   Temp (!) 97.4 F (36.3 C)   Ht 5' 6.5" (1.689 m)   Wt 124 lb (56.2 kg)   BMI 19.71 kg/m  , BMI Body mass index is 19.71 kg/m. GEN: Well nourished, well developed, in no acute distress  HEENT: normal  Neck: no JVD, carotid bruits, or masses Cardiac: RRR; there is a very soft diastolic murmur RUSB 1/6, no rubs, or gallops,no edema. No lift Respiratory:  clear to auscultation bilaterally, normal work of breathing GI: soft, nontender, nondistended, + BS MS: no deformity or atrophy  Skin: warm and dry, no rash Neuro:  Strength and sensation are intact Psych: euthymic mood, full affect   EKG:  EKG is ordered today. The ekg ordered today demonstrates NSR with normal Ecg. Rate 65. I have personally reviewed and interpreted this study.    Recent Labs: No results found for requested labs within last 8760 hours.    Lipid Panel No results found for: CHOL, TRIG, HDL, CHOLHDL, VLDL, LDLCALC, LDLDIRECT   Labs dated 08/10/15: cholesterol 246, triglycerides 105, HDL 90, ALT and TSH normal Dated 01/01/17: Hgb 11.7. BMET normal. Dated 10/11/17: A1c 5.2%. cholesterol 249, triglycerides 114, HDL 81, LDL 145. Kidney function normal. Dated 05/07/18: Hgb 12.3.  CMET and TSH is normal.   Wt Readings from Last 3 Encounters:  05/13/19 124 lb (56.2 kg)  05/13/18 129 lb 12.8 oz (58.9 kg)  03/04/16 127 lb (57.6 kg)      Other studies Reviewed:  Echo 05/08/19:IMPRESSIONS    1. Left ventricular ejection fraction, by visual estimation, is 60 to  65%. The left ventricle has normal function. There is no left ventricular  hypertrophy.  2. Left ventricular diastolic parameters are consistent with Grade I  diastolic dysfunction (impaired relaxation).  3. The left ventricle has no regional wall motion abnormalities.  4. Global right ventricle has normal systolic function.The right  ventricular size  is normal. No increase in right ventricular wall  thickness.  5. Left atrial size was normal.  6. Right atrial size was normal.  7. The mitral valve is normal in structure. No evidence of mitral valve  regurgitation. No evidence of mitral stenosis.  8. The tricuspid valve is normal in structure.  9. The tricuspid valve is normal in structure. Tricuspid valve  regurgitation is not demonstrated.  10. Aortic valve regurgitation is moderate.  11. The aortic valve is normal in structure. Aortic valve regurgitation is  moderate. Mild to moderate aortic valve sclerosis/calcification without  any evidence of aortic stenosis.  12. The pulmonic valve was normal in structure. Pulmonic valve  regurgitation is not visualized.  13. The inferior vena cava is normal in size with greater than 50%  respiratory variability, suggesting right atrial pressure of 3 mmHg.  14. The average left ventricular global longitudinal strain is -21.1 %.    ASSESSMENT AND PLAN:  1.  Aortic insufficiency. Asymptomatic. Moderate by recent Echo.  No stigmata of chronic AI. Ecg is normal. LV size and function normal. She is asymptomatic. At this point I have reassured her. Will plan repeat Echo every other year with follow up office visit in one year.    Current medicines are reviewed at length with the patient today.  The patient does not have concerns regarding medicines.  The following changes have been made:  no change  Labs/ tests ordered today include: No orders of the defined types were placed in this encounter.    Disposition:   FU with me  in 1 year  Signed, Ezmeralda Stefanick Martinique, MD  05/13/2019 [facility] [address], [place], [place], [address] Phone [phone], Fax [phone]

## 2019-05-12 ENCOUNTER — Other Ambulatory Visit: Payer: Self-pay | Admitting: Obstetrics and Gynecology

## 2019-05-12 DIAGNOSIS — R928 Other abnormal and inconclusive findings on diagnostic imaging of breast: Secondary | ICD-10-CM

## 2019-05-13 ENCOUNTER — Other Ambulatory Visit: Payer: Self-pay

## 2019-05-13 ENCOUNTER — Encounter: Payer: Self-pay | Admitting: Cardiology

## 2019-05-13 ENCOUNTER — Ambulatory Visit: Payer: Managed Care, Other (non HMO) | Admitting: Cardiology

## 2019-05-13 VITALS — BP 108/78 | HR 65 | Temp 97.4°F | Ht 66.5 in | Wt 124.0 lb

## 2019-05-13 DIAGNOSIS — I351 Nonrheumatic aortic (valve) insufficiency: Secondary | ICD-10-CM | POA: Diagnosis not present

## 2019-05-22 ENCOUNTER — Other Ambulatory Visit: Payer: Managed Care, Other (non HMO)

## 2019-06-04 ENCOUNTER — Ambulatory Visit
Admission: RE | Admit: 2019-06-04 | Discharge: 2019-06-04 | Disposition: A | Discharge status: Home / Self Care | Payer: Managed Care, Other (non HMO) | Source: Ambulatory Visit | Attending: Obstetrics and Gynecology | Admitting: Obstetrics and Gynecology

## 2019-06-04 ENCOUNTER — Other Ambulatory Visit: Payer: Self-pay

## 2019-06-04 DIAGNOSIS — R928 Other abnormal and inconclusive findings on diagnostic imaging of breast: Secondary | ICD-10-CM

## 2019-06-19 ENCOUNTER — Other Ambulatory Visit: Payer: Self-pay | Admitting: Obstetrics and Gynecology

## 2019-06-19 DIAGNOSIS — Z9189 Other specified personal risk factors, not elsewhere classified: Secondary | ICD-10-CM

## 2019-07-01 ENCOUNTER — Ambulatory Visit
Admission: RE | Admit: 2019-07-01 | Discharge: 2019-07-01 | Disposition: A | Discharge status: Home / Self Care | Payer: Managed Care, Other (non HMO) | Source: Ambulatory Visit | Attending: Obstetrics and Gynecology | Admitting: Obstetrics and Gynecology

## 2019-07-01 ENCOUNTER — Other Ambulatory Visit: Payer: Self-pay

## 2019-07-01 DIAGNOSIS — Z9189 Other specified personal risk factors, not elsewhere classified: Secondary | ICD-10-CM

## 2019-07-01 MED ORDER — GADOBUTROL 1 MMOL/ML IV SOLN
6.0000 mL | Freq: Once | INTRAVENOUS | Status: AC | PRN
  Administered 2019-07-01: 6 mL via INTRAVENOUS

## 2019-07-07 ENCOUNTER — Encounter: Payer: Self-pay | Admitting: Internal Medicine

## 2019-08-01 ENCOUNTER — Ambulatory Visit (AMBULATORY_SURGERY_CENTER): Payer: Self-pay | Admitting: *Deleted

## 2019-08-01 ENCOUNTER — Other Ambulatory Visit: Payer: Self-pay

## 2019-08-01 VITALS — Temp 96.4°F | Ht 66.5 in | Wt 125.8 lb

## 2019-08-01 DIAGNOSIS — Z1211 Encounter for screening for malignant neoplasm of colon: Secondary | ICD-10-CM

## 2019-08-01 MED ORDER — SUPREP BOWEL PREP KIT 17.5-3.13-1.6 GM/177ML PO SOLN: ORAL | 0 refills | Status: DC

## 2019-08-01 NOTE — Progress Notes (Signed)
Slight nausea after sedation, no trouble moving neck, fam hx of malignant hyperthermia  2nd covid vaccine 07-08-19  Pt is aware that care partner will wait in the car during procedure; if they feel like they will be too hot or cold to wait in the car; they may wait in the 4 th floor lobby. Patient is aware to bring only one care partner. We want them to wear a mask (we do not have any that we can provide them), practice social distancing, and we will check their temperatures when they get here.  I did remind the patient that their care partner needs to stay in the parking lot the entire time and have a cell phone available, we will call them when the pt is ready for discharge. Patient will wear mask into building.   No egg or soy allergy  No home oxygen use   No medications for weight loss taken  Pt denies constipation issues  Suprep coupon given and code put into RX

## 2019-08-13 ENCOUNTER — Encounter: Payer: Self-pay | Admitting: Internal Medicine

## 2019-08-15 ENCOUNTER — Encounter: Payer: Self-pay | Admitting: Internal Medicine

## 2019-08-15 ENCOUNTER — Ambulatory Visit (AMBULATORY_SURGERY_CENTER): Payer: Managed Care, Other (non HMO) | Admitting: Internal Medicine

## 2019-08-15 ENCOUNTER — Other Ambulatory Visit: Payer: Self-pay

## 2019-08-15 VITALS — BP 132/81 | HR 66 | Temp 95.9°F | Resp 11 | Ht 66.5 in | Wt 125.8 lb

## 2019-08-15 DIAGNOSIS — D129 Benign neoplasm of anus and anal canal: Secondary | ICD-10-CM

## 2019-08-15 DIAGNOSIS — Z1211 Encounter for screening for malignant neoplasm of colon: Secondary | ICD-10-CM

## 2019-08-15 DIAGNOSIS — K621 Rectal polyp: Secondary | ICD-10-CM | POA: Diagnosis not present

## 2019-08-15 MED ORDER — SODIUM CHLORIDE 0.9 % IV SOLN: 500.0000 mL | Freq: Once | INTRAVENOUS | Status: DC

## 2019-08-15 NOTE — Patient Instructions (Signed)
Handout provided on polyps.   YOU HAD AN ENDOSCOPIC PROCEDURE TODAY AT THE [facility]:   Refer to the procedure report that was given to you for any specific questions about what was found during the examination.  If the procedure report does not answer your questions, please call your gastroenterologist to clarify.  If you requested that your care partner not be given the details of your procedure findings, then the procedure report has been included in a sealed envelope for you to review at your convenience later.  YOU SHOULD EXPECT: Some feelings of bloating in the abdomen. Passage of more gas than usual.  Walking can help get rid of the air that was put into your GI tract during the procedure and reduce the bloating. If you had a lower endoscopy (such as a colonoscopy or flexible sigmoidoscopy) you may notice spotting of blood in your stool or on the toilet paper. If you underwent a bowel prep for your procedure, you may not have a normal bowel movement for a few days.  Please Note:  You might notice some irritation and congestion in your nose or some drainage.  This is from the oxygen used during your procedure.  There is no need for concern and it should clear up in a day or so.  SYMPTOMS TO REPORT IMMEDIATELY:  Following lower endoscopy (colonoscopy or flexible sigmoidoscopy):  Excessive amounts of blood in the stool  Significant tenderness or worsening of abdominal pains  Swelling of the abdomen that is new, acute  Fever of 100F or higher  For urgent or emergent issues, a gastroenterologist can be reached at any hour by calling [phone]. Do not use MyChart messaging for urgent concerns.    DIET:  We do recommend a small meal at first, but then you may proceed to your regular diet.  Drink plenty of fluids but you should avoid alcoholic beverages for 24 hours.  ACTIVITY:  You should plan to take it easy for the rest of today and you should NOT DRIVE or use heavy  machinery until tomorrow (because of the sedation medicines used during the test).    FOLLOW UP: Our staff will call the number listed on your records 48-72 hours following your procedure to check on you and address any questions or concerns that you may have regarding the information given to you following your procedure. If we do not reach you, we will leave a message.  We will attempt to reach you two times.  During this call, we will ask if you have developed any symptoms of COVID 19. If you develop any symptoms (ie: fever, flu-like symptoms, shortness of breath, cough etc.) before then, please call [phone].  If you test positive for Covid 19 in the 2 weeks post procedure, please call and report this information to us.    If any biopsies were taken you will be contacted by phone or by letter within the next 1-3 weeks.  Please call us at [phone] if you have not heard about the biopsies in 3 weeks.    SIGNATURES/CONFIDENTIALITY: You and/or your care partner have signed paperwork which will be entered into your electronic medical record.  These signatures attest to the fact that that the information above on your After Visit Summary has been reviewed and is understood.  Full responsibility of the confidentiality of this discharge information lies with you and/or your care-partner.  

## 2019-08-15 NOTE — Progress Notes (Signed)
Pt's states no medical or surgical changes since previsit or office visit. 

## 2019-08-15 NOTE — Op Note (Addendum)
[facility] Patient Name: Gerrie Lajaunie Procedure Date: 08/15/2019 12:45 PM MRN: [id number] Endoscopist: Docia Chuck. Henrene Pastor , MD Age: 64 Referring MD:  Date of Birth: 04/29/1955 Gender: Female Account #: [id number] Procedure:                Colonoscopy with cold snare polypectomy x 1 Indications:              Screening for colorectal malignant neoplasm Medicines:                Monitored Anesthesia Care Procedure:                Pre-Anesthesia Assessment:                           - Prior to the procedure, a History and Physical                            was performed, and patient medications and                            allergies were reviewed. The patient's tolerance of                            previous anesthesia was also reviewed. The risks                            and benefits of the procedure and the sedation                            options and risks were discussed with the patient.                            All questions were answered, and informed consent                            was obtained. Prior Anticoagulants: The patient has                            taken no previous anticoagulant or antiplatelet                            agents. ASA Grade Assessment: II - A patient with                            mild systemic disease. After reviewing the risks                            and benefits, the patient was deemed in                            satisfactory condition to undergo the procedure.                           After obtaining informed consent, the colonoscope  was passed under direct vision. Throughout the                            procedure, the patient's blood pressure, pulse, and                            oxygen saturations were monitored continuously. The                            Colonoscope was introduced through the anus and                            advanced to the the cecum, identified by    appendiceal orifice and ileocecal valve. The                            ileocecal valve, appendiceal orifice, and rectum                            were photographed. The quality of the bowel                            preparation was excellent. The colonoscopy was                            performed without difficulty. The patient tolerated                            the procedure well. The bowel preparation used was                            SUPREP via split dose instruction. Scope In: 12:57:19 PM Scope Out: 1:08:39 PM Scope Withdrawal Time: 0 hours 8 minutes 16 seconds  Total Procedure Duration: 0 hours 11 minutes 20 seconds  Findings:                 A 3 mm polyp was found in the rectum. The polyp was                            removed with a cold snare. Resection and retrieval                            were complete.                           The exam was otherwise without abnormality on                            direct and retroflexion views. Complications:            No immediate complications. Estimated blood loss:                            None. Estimated Blood Loss:     Estimated blood loss: none. Impression:               -  One 3 mm polyp in the rectum, removed with a cold                            snare. Resected and retrieved.                           - The examination was otherwise normal on direct                            and retroflexion views. Recommendation:           - Repeat colonoscopy in 10 years for surveillance.                           - Patient has a contact number available for                            emergencies. The signs and symptoms of potential                            delayed complications were discussed with the                            patient. Return to normal activities tomorrow.                            Written discharge instructions were provided to the                            patient.                           - Resume  previous diet.                           - Continue present medications.                           - Await pathology results. Docia Chuck. Henrene Pastor, MD 08/15/2019 1:13:02 PM This report has been signed electronically.

## 2019-08-15 NOTE — Progress Notes (Signed)
Called to room to assist during endoscopic procedure.  Patient ID and intended procedure confirmed with present staff. Received instructions for my participation in the procedure from the performing physician.  

## 2019-08-15 NOTE — Progress Notes (Signed)
A/ox3, pleased with MAC, report to RN 

## 2019-08-19 ENCOUNTER — Telehealth: Payer: Self-pay | Admitting: *Deleted

## 2019-08-19 ENCOUNTER — Encounter: Payer: Self-pay | Admitting: Internal Medicine

## 2019-08-19 NOTE — Telephone Encounter (Signed)
  Follow up Call-  Call back number 08/15/2019  Post procedure Call Back phone  # [phone]  Permission to leave phone message Yes  Some recent data might be hidden     Patient questions:  Do you have a fever, pain , or abdominal swelling? No. Pain Score  0 *  Have you tolerated food without any problems? Yes.    Have you been able to return to your normal activities? Yes.    Do you have any questions about your discharge instructions: Diet   No. Medications  No. Follow up visit  No.  Do you have questions or concerns about your Care? No.  Actions: * If pain score is 4 or above: No action needed, pain <4.  1. Have you developed a fever since your procedure? no  2.   Have you had an respiratory symptoms (SOB or cough) since your procedure? no  3.   Have you tested positive for COVID 19 since your procedure no  4.   Have you had any family members/close contacts diagnosed with the COVID 19 since your procedure?  no   If yes to any of these questions please route to Joylene John, RN and Erenest Rasher, RN

## 2019-08-23 ENCOUNTER — Emergency Department: Payer: Managed Care, Other (non HMO)

## 2019-08-23 ENCOUNTER — Emergency Department
Admission: EM | Admit: 2019-08-23 | Discharge: 2019-08-23 | Disposition: A | Discharge status: Home / Self Care | Payer: Managed Care, Other (non HMO) | Attending: Emergency Medicine | Admitting: Emergency Medicine

## 2019-08-23 ENCOUNTER — Encounter: Payer: Self-pay | Admitting: Emergency Medicine

## 2019-08-23 ENCOUNTER — Other Ambulatory Visit: Payer: Self-pay

## 2019-08-23 DIAGNOSIS — W010XXA Fall on same level from slipping, tripping and stumbling without subsequent striking against object, initial encounter: Secondary | ICD-10-CM | POA: Insufficient documentation

## 2019-08-23 DIAGNOSIS — Y939 Activity, unspecified: Secondary | ICD-10-CM | POA: Diagnosis not present

## 2019-08-23 DIAGNOSIS — Y999 Unspecified external cause status: Secondary | ICD-10-CM | POA: Insufficient documentation

## 2019-08-23 DIAGNOSIS — S52571A Other intraarticular fracture of lower end of right radius, initial encounter for closed fracture: Secondary | ICD-10-CM | POA: Insufficient documentation

## 2019-08-23 DIAGNOSIS — Y929 Unspecified place or not applicable: Secondary | ICD-10-CM | POA: Diagnosis not present

## 2019-08-23 DIAGNOSIS — Z79899 Other long term (current) drug therapy: Secondary | ICD-10-CM | POA: Diagnosis not present

## 2019-08-23 DIAGNOSIS — S6991XA Unspecified injury of right wrist, hand and finger(s), initial encounter: Secondary | ICD-10-CM | POA: Diagnosis present

## 2019-08-23 MED ORDER — HYDROCODONE-ACETAMINOPHEN 5-325 MG PO TABS: 1.0000 | ORAL_TABLET | ORAL | 0 refills | Status: DC | PRN

## 2019-08-23 MED ORDER — OXYCODONE-ACETAMINOPHEN 5-325 MG PO TABS: 1.0000 | ORAL_TABLET | Freq: Once | ORAL | Status: DC

## 2019-08-23 NOTE — ED Triage Notes (Signed)
Pt to Ed via POV c/o right wrist pain s/p fall. Pt is in NAD.

## 2019-08-23 NOTE — ED Provider Notes (Signed)
[facility] Emergency Department Provider Note  ____________________________________________  Time seen: Approximately 6:57 PM  I have reviewed the triage vital signs and the nursing notes.   HISTORY  Chief Complaint Wrist Pain    HPI Melissa Montoya is a 64 y.o. female who presents to the emergency department complaining of right wrist pain.  Patient states that she tripped, fell onto an outstretched hand.  She is having swelling, pain to the right wrist.  No other injury or complaint.  No medications prior to arrival.  No history of previous injury to the right wrist.         Past Medical History:  Diagnosis Date  . Allergy   . Heart murmur   . Hyperlipidemia    no meds    There are no problems to display for this patient.   Past Surgical History:  Procedure Laterality Date  . BREAST BIOPSY    . COLONOSCOPY      Prior to Admission medications   Medication Sig Start Date End Date Taking? Authorizing Provider  cetirizine (ZYRTEC) 10 MG tablet Take 10 mg by mouth daily.    [provider]  HYDROcodone-acetaminophen (NORCO/VICODIN) 5-325 MG tablet Take 1 tablet by mouth every 4 (four) hours as needed for moderate pain. 08/23/19   Ruble Pumphrey, Charline Bills, PA-C  triamcinolone cream (KENALOG) 0.1 % PRN 06/24/19   [provider]  valACYclovir HCl (VALTREX PO) Take by mouth. PRN for fever blister    [provider]    Allergies Patient has no known allergies.  Family History  Problem Relation Age of Onset  . Breast cancer Mother   . Lung cancer Father   . Hypertension Father   . Hyperlipidemia Sister   . Colon cancer Neg Hx   . Esophageal cancer Neg Hx   . Rectal cancer Neg Hx   . Stomach cancer Neg Hx     Social History Social History   Tobacco Use  . Smoking status: Never Smoker  . Smokeless tobacco: Never Used  Substance Use Topics  . Alcohol use: Yes    Comment: daily  . Drug use: No     Review  of Systems  Constitutional: No fever/chills Eyes: No visual changes. No discharge ENT: No upper respiratory complaints. Cardiovascular: no chest pain. Respiratory: no cough. No SOB. Gastrointestinal: No abdominal pain.  No nausea, no vomiting.  No diarrhea.  No constipation. Musculoskeletal: Right wrist injury Skin: Negative for rash, abrasions, lacerations, ecchymosis. Neurological: Negative for headaches, focal weakness or numbness. 10-point ROS otherwise negative.  ____________________________________________   PHYSICAL EXAM:  VITAL SIGNS: ED Triage Vitals  Enc Vitals Group     BP 08/23/19 1730 98/61     Pulse Rate 08/23/19 1730 78     Resp 08/23/19 1730 16     Temp 08/23/19 1730 98.2 F (36.8 C)     Temp Source 08/23/19 1730 Oral     SpO2 08/23/19 1730 100 %     Weight 08/23/19 1731 125 lb (56.7 kg)     Height 08/23/19 1731 5\' 6"  (1.676 m)     Head Circumference --      Peak Flow --      Pain Score 08/23/19 1731 8     Pain Loc --      Pain Edu? --      Excl. in [place]? --      Constitutional: Alert and oriented. Well appearing and in no acute distress. Eyes: Conjunctivae are normal.  PERRL. EOMI. Head: Atraumatic. ENT:      Ears:       Nose: No congestion/rhinnorhea.      Mouth/Throat: Mucous membranes are moist.  Neck: No stridor.    Cardiovascular: Normal rate, regular rhythm. Normal S1 and S2.  Good peripheral circulation. Respiratory: Normal respiratory effort without tachypnea or retractions. Lungs CTAB. Good air entry to the bases with no decreased or absent breath sounds. Musculoskeletal: Full range of motion to all extremities. No gross deformities appreciated.  Moderate edema of the right wrist.  Palpation reveals significant tenderness over the distal radius with possible palpable abnormality.  No range of motion is performed at this time.  Good radial pulse.  Sensation intact all digits.  Full range of motion to all digits.  Examination of the hand and elbow  is unremarkable. Neurologic:  Normal speech and language. No gross focal neurologic deficits are appreciated.  Skin:  Skin is warm, dry and intact. No rash noted. Psychiatric: Mood and affect are normal. Speech and behavior are normal. Patient exhibits appropriate insight and judgement.   ____________________________________________   LABS (all labs ordered are listed, but only abnormal results are displayed)  Labs Reviewed - No data to display ____________________________________________  EKG   ____________________________________________  RADIOLOGY I personally viewed and evaluated these images as part of my medical decision making, as well as reviewing the written report by the radiologist.  DG Wrist Complete Right  Result Date: 08/23/2019 CLINICAL DATA:  Pain after fall EXAM: RIGHT WRIST - COMPLETE 3+ VIEW COMPARISON:  None. FINDINGS: There is a comminuted fracture of the distal radius extending into the radiocarpal joint. No definite ulnar styloid fracture. The distal ulna is unremarkable. Carpal bones are intact. No other abnormalities identified. IMPRESSION: Comminuted displaced distal radius fracture extending into the radiocarpal joint. Electronically Signed   By: Dorise Bullion III M.D   On: 08/23/2019 18:39    ____________________________________________    PROCEDURES  Procedure(s) performed:    Procedures    Medications - No data to display   ____________________________________________   INITIAL IMPRESSION / ASSESSMENT AND PLAN / ED COURSE  Pertinent labs & imaging results that were available during my care of the patient were reviewed by me and considered in my medical decision making (see chart for details).  Review of the [place] CSRS was performed in accordance of the [place] prior to dispensing any controlled drugs.           Patient's diagnosis is consistent with distal radius fracture.  Patient presented to the emergency department complaining of  right wrist pain after a fall.  No other reported injuries.  Imaging reveals comminuted impacted fracture.  Patient was neurovascularly intact.  Splint applied.  Patient was driving and requested no narcotics at this time.  I will send in a prescription for limited narcotics for pain.  She lives in [place] and states that she will follow up with orthopedics in [place] but I did provide referral to [facility] if she is unable to secure orthopedic service in [place]..  Patient is given ED precautions to return to the ED for any worsening or new symptoms.     ____________________________________________  FINAL CLINICAL IMPRESSION(S) / ED DIAGNOSES  Final diagnoses:  Other closed intra-articular fracture of distal end of right radius, initial encounter      NEW MEDICATIONS STARTED DURING THIS VISIT:  ED Discharge Orders         Ordered    HYDROcodone-acetaminophen (NORCO/VICODIN) 5-325 MG tablet  Every 4 hours PRN     08/23/19 1904              This chart was dictated using voice recognition software/Dragon. Despite best efforts to proofread, errors can occur which can change the meaning. Any change was purely unintentional.    Brynda Peon 08/23/19 1904    Nance Pear, MD 08/23/19 [phone]

## 2019-08-30 IMAGING — MR MR BREAST BIOPSY
9 of 12 series · 35 of 48 positions shown · IV contrast (6 ml gadavist)
Comparison: Previous exams.

Addendum:
CLINICAL DATA: The patient presents for MR guided core biopsy of
the LEFT.

EXAM:
MRI GUIDED CORE NEEDLE BIOPSY OF THE LEFT BREAST
TECHNIQUE: Multiplanar, multisequence MR imaging of the LEFT breast was
performed both before and after administration of intravenous
contrast.
CONTRAST:  6 ml Gadavist

[Series 2: fiducial unilateral · sagittal · 2.0mm · 1.33mm/px · 3 of 52 slices shown]
[im 1/52]
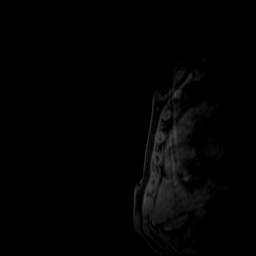
[im 26/52]
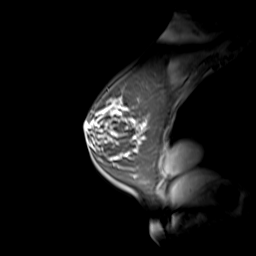
[im 52/52]
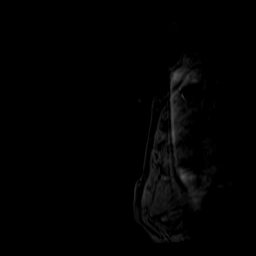

[Series 3: dynamic pre · axial · non-contrast · 1.3mm · 0.73mm/px · z∈[-92,+93]mm · 5 of 144 slices shown]
[im 1/144]
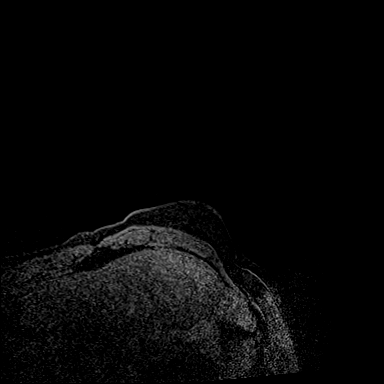
[im 36/144]
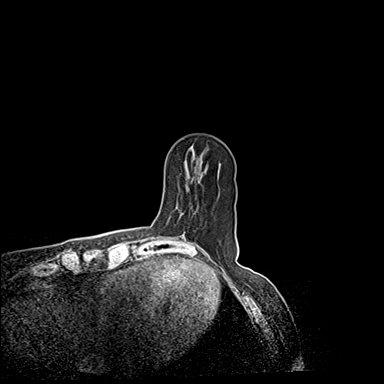
[im 72/144]
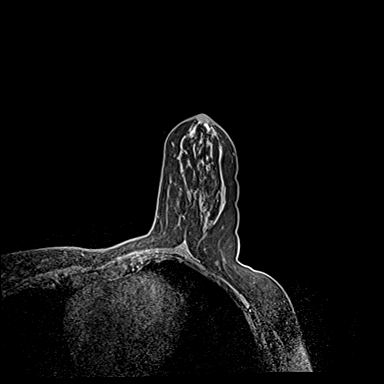
[im 108/144]
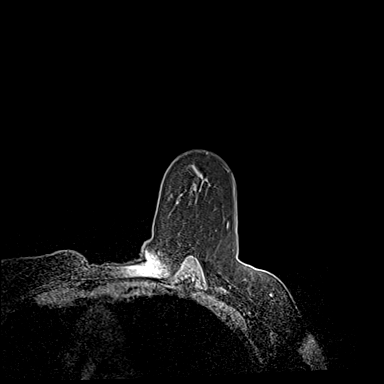
[im 144/144]
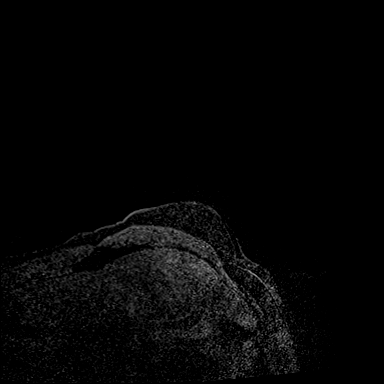

[Series 4: dynamic post 20 · axial · 1.3mm · 0.73mm/px · z∈[-92,+93]mm · 4 of 144 slices shown (1 of 2)]
[im 1/144]
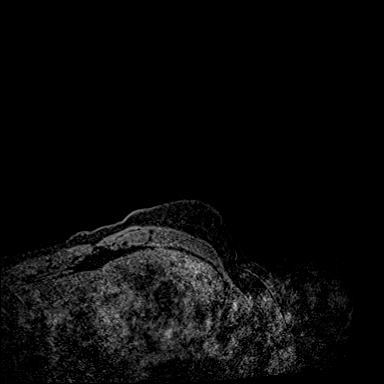
[im 48/144]
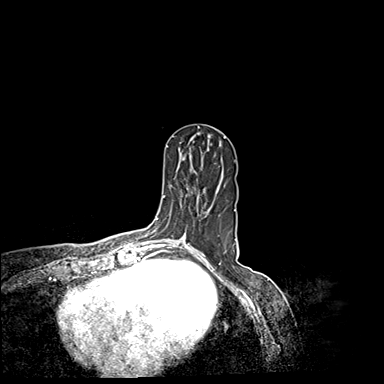
[im 96/144]
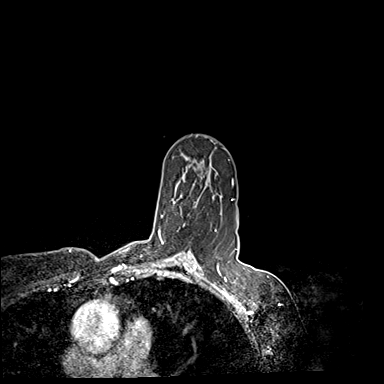
[im 144/144]
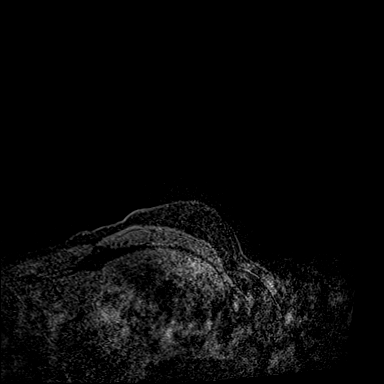

[Series 5: dynamic post 20 · axial · 1.3mm · 0.73mm/px · z∈[-92,+93]mm · 4 of 144 slices shown (2 of 2)]
[im 1/144]
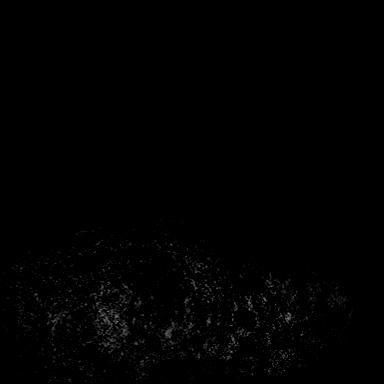
[im 48/144]
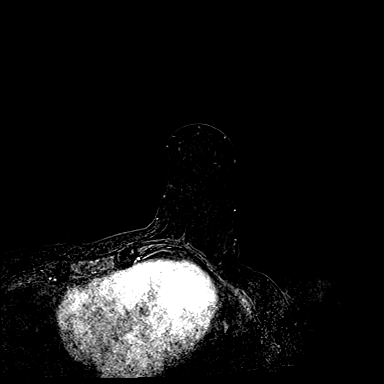
[im 96/144]
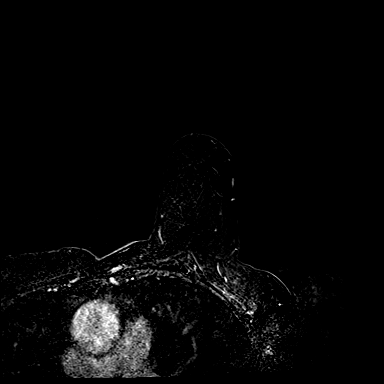
[im 144/144]
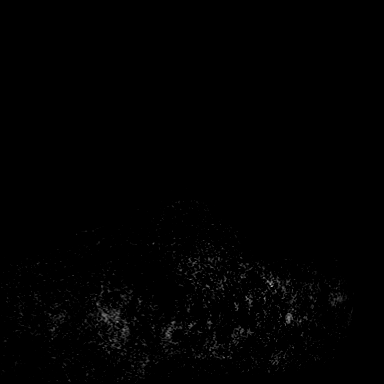

[Series 6: dynamic post 3 · axial · 1.3mm · 0.73mm/px · z∈[-92,+93]mm · 4 of 144 slices shown (1 of 2)]
[im 1/144]
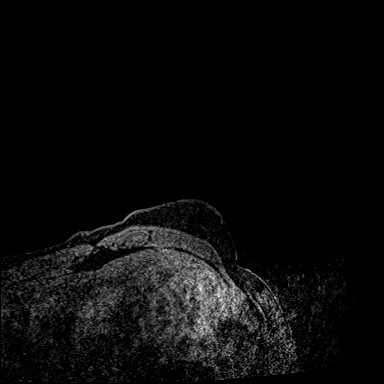
[im 48/144]
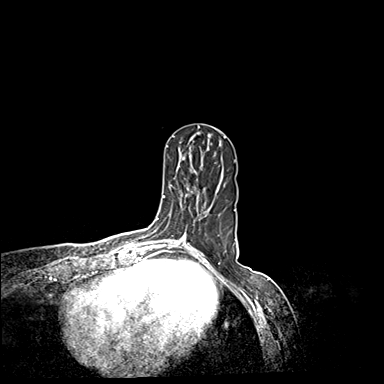
[im 96/144]
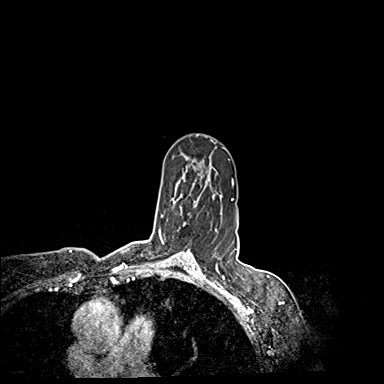
[im 144/144]
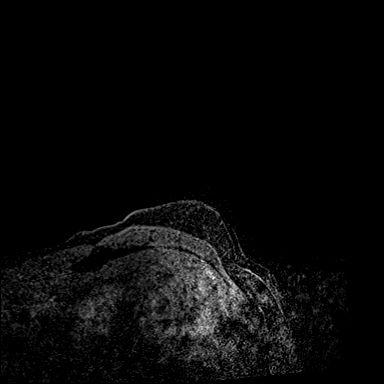

[Series 7: dynamic post 3 · axial · 1.3mm · 0.73mm/px · z∈[-92,+93]mm · 4 of 144 slices shown (2 of 2)]
[im 1/144]
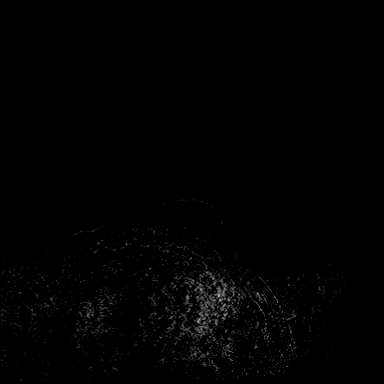
[im 48/144]
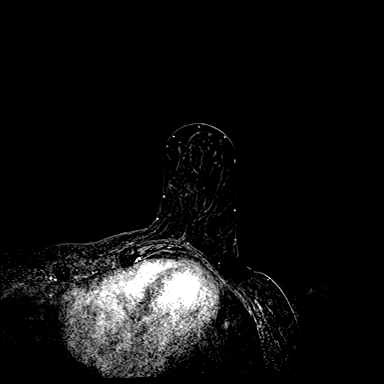
[im 96/144]
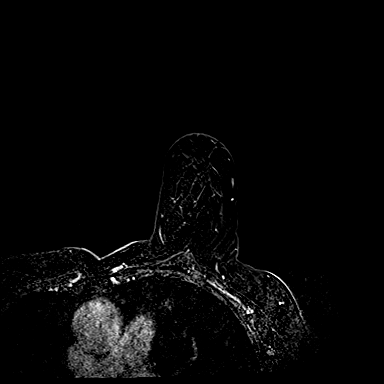
[im 144/144]
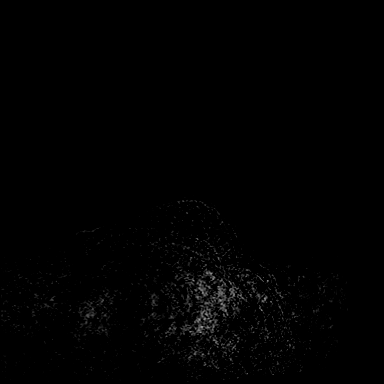

[Series 8: dynamic post 5 · axial · 1.3mm · 0.73mm/px · z∈[-92,+93]mm · 4 of 144 slices shown (1 of 2)]
[im 1/144]
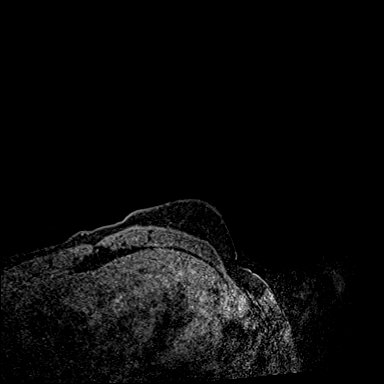
[im 48/144]
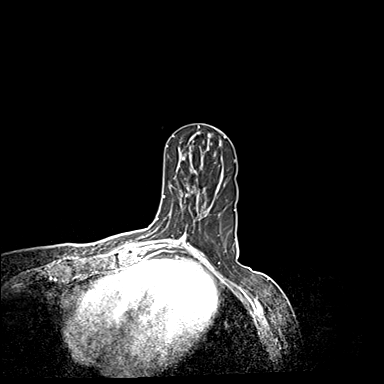
[im 96/144]
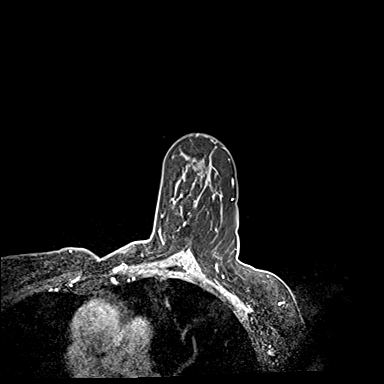
[im 144/144]
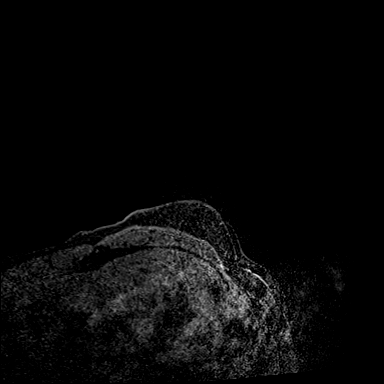

[Series 9: dynamic post 5 · axial · 1.3mm · 0.73mm/px · z∈[-92,+93]mm · 4 of 144 slices shown (2 of 2)]
[im 1/144]
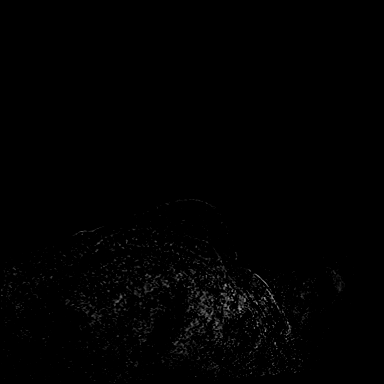
[im 48/144]
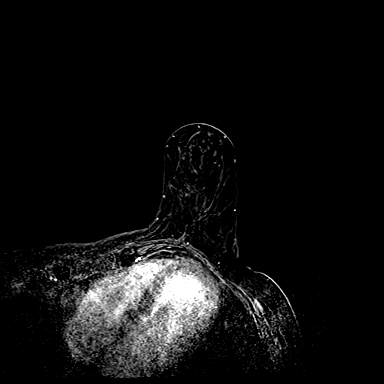
[im 96/144]
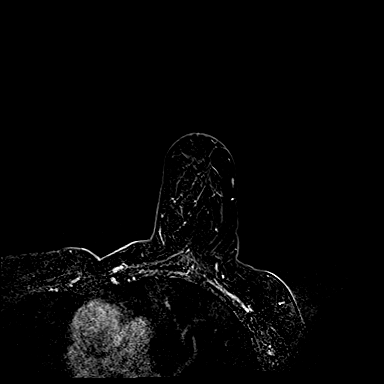
[im 144/144]
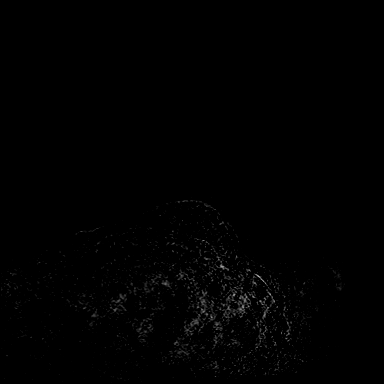

[Series 10: needle confirmation · axial · 1.3mm · 0.73mm/px · z∈[-92,+31]mm · 3 of 144 slices shown]
[im 1/144]
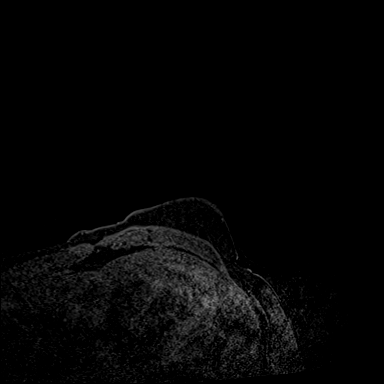
[im 48/144]
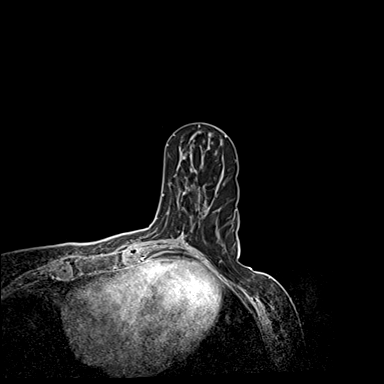
[im 96/144]
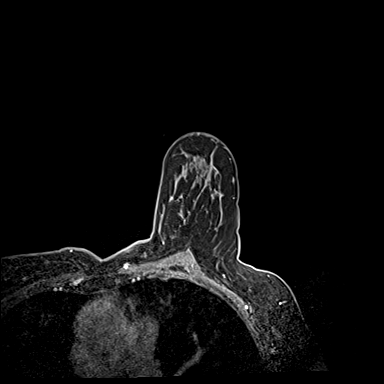

[35 of 48 positions shown; findings below may reference images not displayed]

FINDINGS: I met with the patient, and we discussed the procedure of MRI guided
biopsy, including risks, benefits, and alternatives. Specifically,
we discussed the risks of infection, bleeding, tissue injury, clip
migration, and inadequate sampling. Informed, written consent was
given. The usual time out protocol was performed immediately prior
to the procedure.

Using sterile technique, 1% Lidocaine, MRI guidance, and a 9 gauge
vacuum assisted device, biopsy was performed of focal enhancement in
the LOWER OUTER QUADRANT of the LEFT breast using a LATERAL to
MEDIAL approach. At the conclusion of the procedure, a
dumbbell-shaped tissue marker clip was deployed into the biopsy
cavity. Follow-up 2-view mammogram was performed and dictated
separately.
IMPRESSION: MRI guided biopsy of focal enhancement in the LEFT breast. No
apparent complications.

ADDENDUM:
Pathology revealed BENIGN BREAST TISSUE of the Left breast, outer
quadrant, (dumbbell clip). This was found to be concordant by Dr.
Ferienhaus Erxleben.

Pathology results were discussed with the patient by telephone. The
patient reported doing well after the biopsy with tenderness and
minimal bleeding at the site. Post biopsy instructions and care were
reviewed and questions were answered. The patient was encouraged to
call The [REDACTED] for any additional
concerns.

The patient was instructed to return for a bilateral breast MRI in 6
months. This has been scheduled for September 23, 2018.

Pathology results reported by Chynara Korte, RN on 04/08/2018.

*** End of Addendum ***

## 2019-08-30 IMAGING — MG MM CLIP PLACEMENT
2 series · 2 of 2 positions shown · non-contrast
Comparison: Previous exam(s).
COMPARISON: Previous exam(s).
COMPARISON: Previous exam(s).
COMPARISON: Previous exam(s).
COMPARISON: Previous exam(s).
COMPARISON: Previous exam(s).
COMPARISON: Previous exam(s).

Addendum:
CLINICAL DATA: Post MRI guided core needle biopsy of left breast 6
mm enhancing nodule, performed by Dr. Chi Dekker.

EXAM:
DIAGNOSTIC LEFT MAMMOGRAM POST MRI BIOPSY

[L CC]
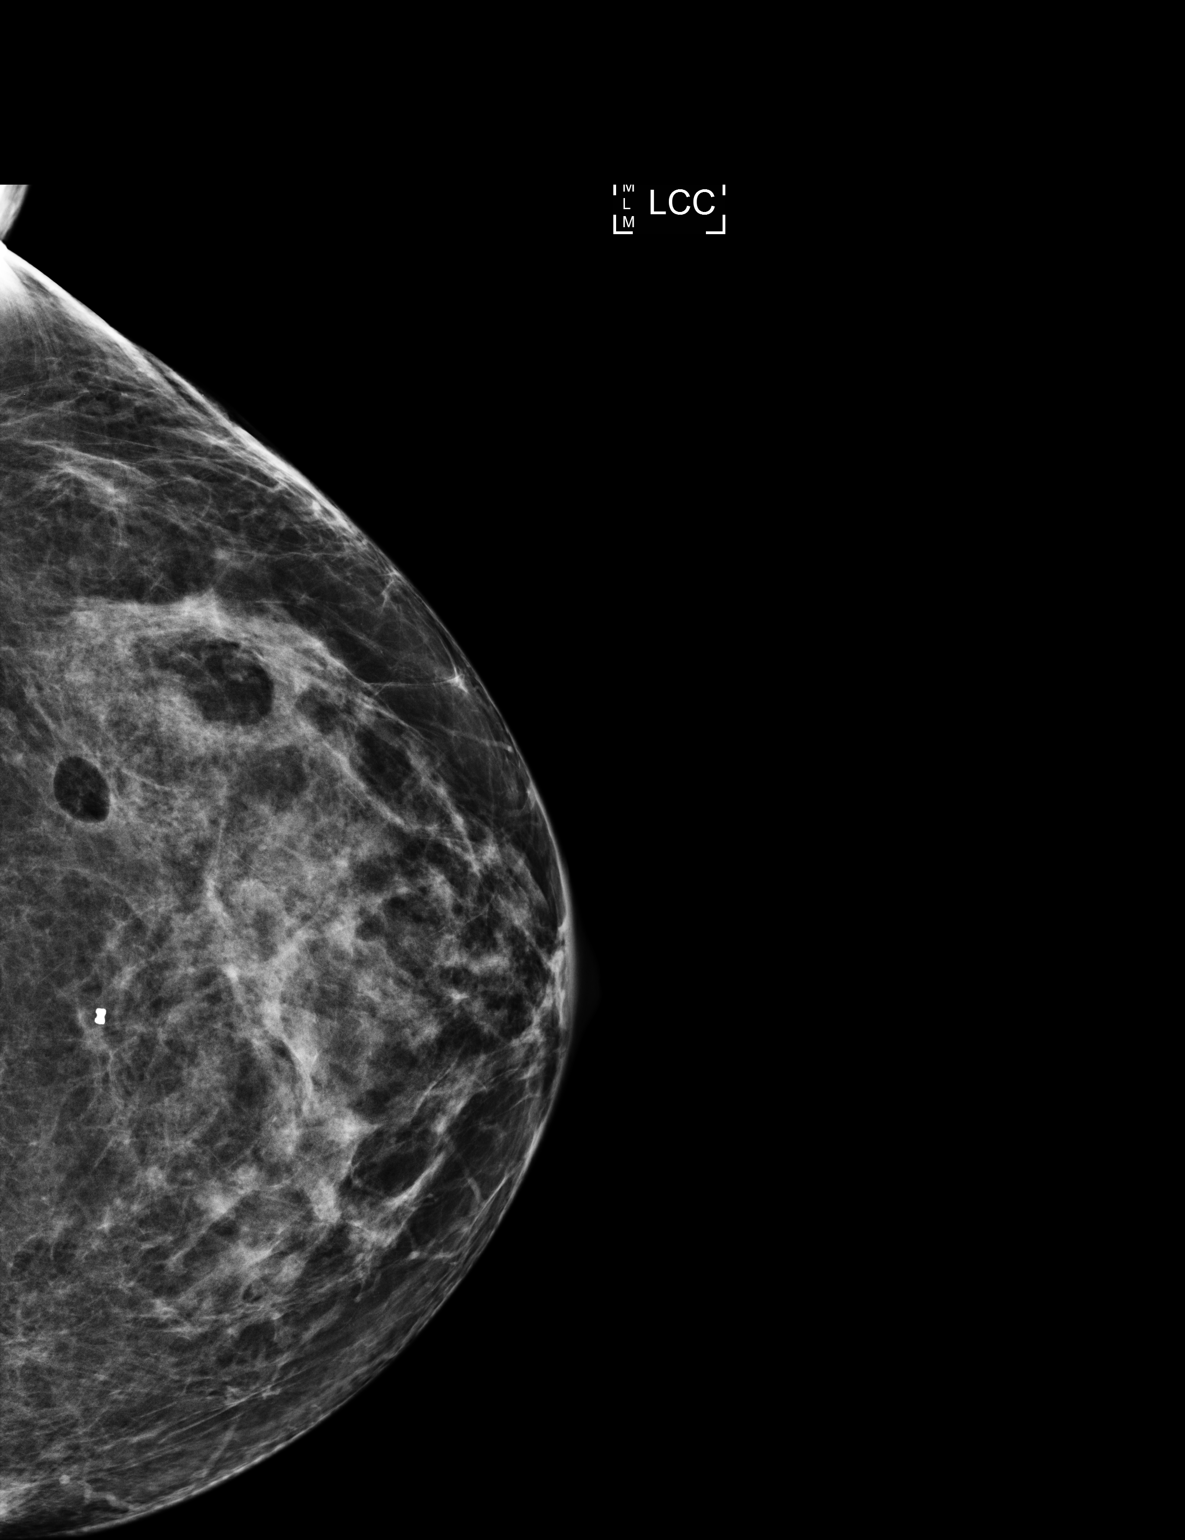

[L ML]
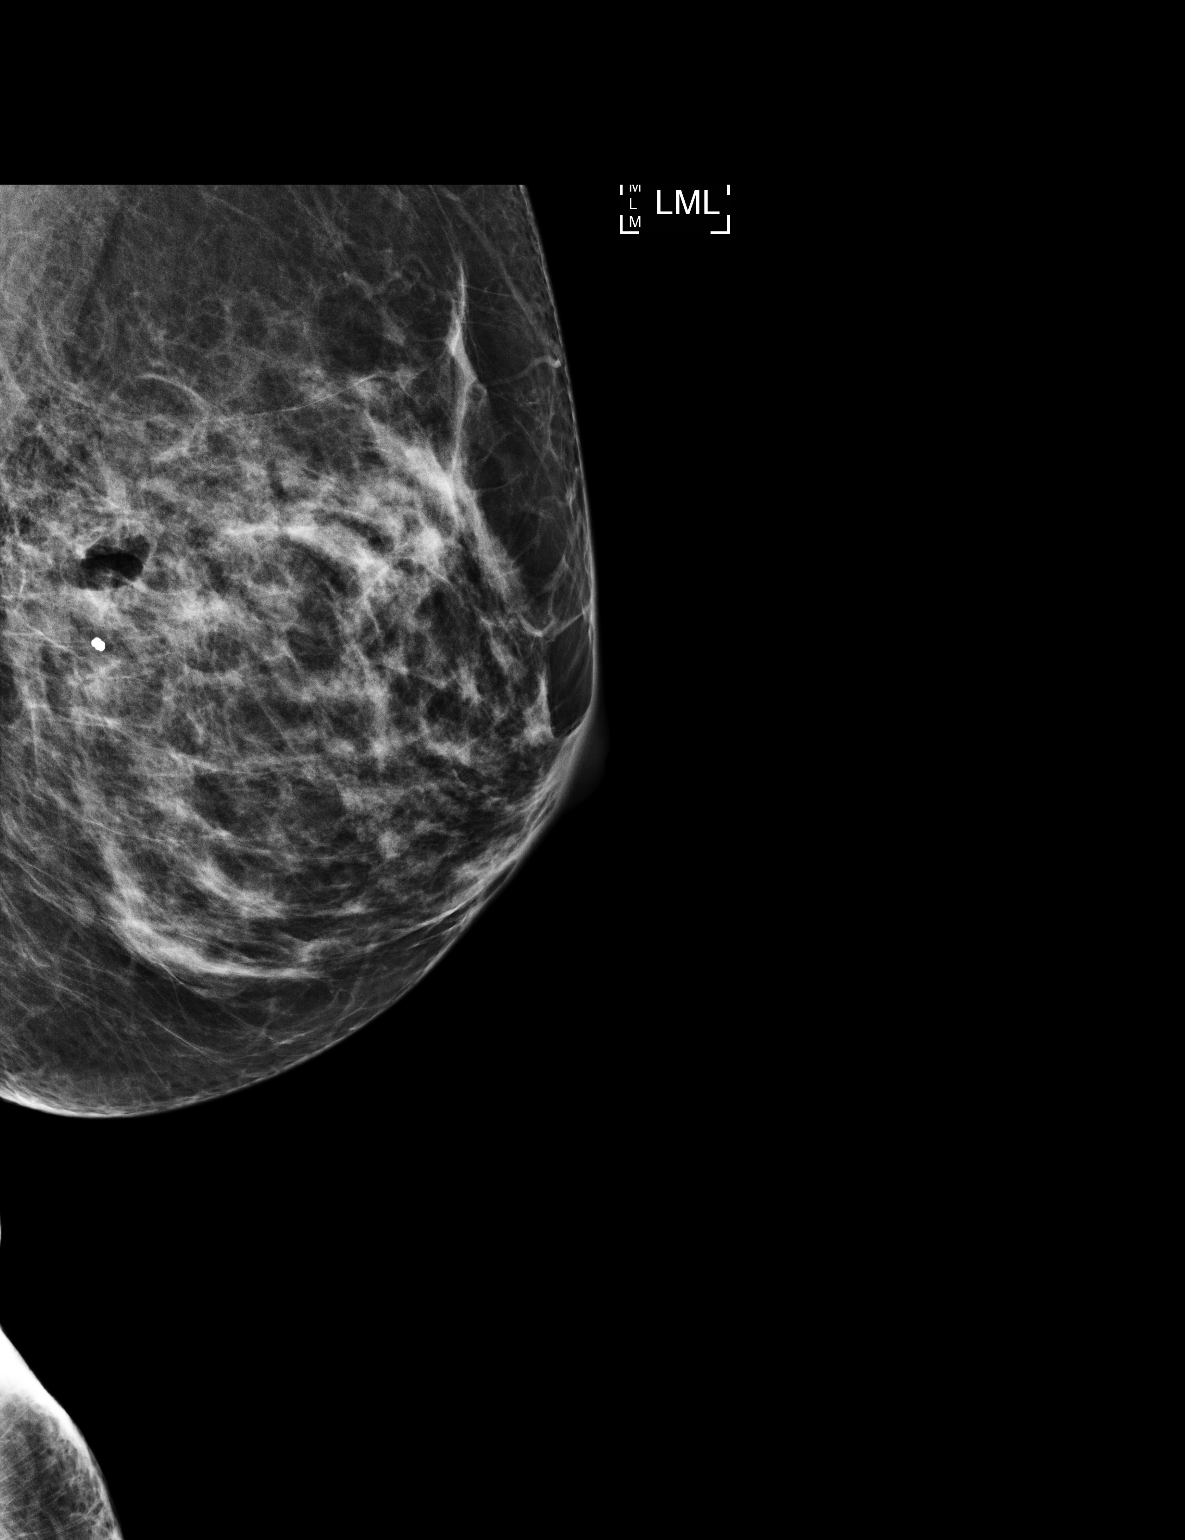

[2 of 2 positions shown; findings below may reference images not displayed]

FINDINGS: Mammographic images were obtained following MRI guided biopsy of
left breast enhancing nodule. Two-view mammography demonstrates
presence of dumbbell-shaped marker in the lower central left breast,
posterior depth. Expected post biopsy changes are noted.
IMPRESSION: Successful placement of dumbbell shaped marker post left breast MRI
guided core needle biopsy.

Final Assessment: Post Procedure Mammograms for Marker Placement

ADDENDUM:
For localization purposes, the clip is 2.5 centimeters LATERAL to
the area of biopsy.

ADDENDUM:
Correction: The dumbbell-shaped clip is in the expected location
following biopsy.

*** End of Addendum ***
Addendum:
FINDINGS: Mammographic images were obtained following MRI guided biopsy of
left breast enhancing nodule. Two-view mammography demonstrates
presence of dumbbell-shaped marker in the lower central left breast,
posterior depth. Expected post biopsy changes are noted.
IMPRESSION: Successful placement of dumbbell shaped marker post left breast MRI
guided core needle biopsy.

Final Assessment: Post Procedure Mammograms for Marker Placement

ADDENDUM:
For localization purposes, the clip is 2.5 centimeters LATERAL to
the area of biopsy.

ADDENDUM:
Correction: The dumbbell-shaped clip is in the expected location
following biopsy.

*** End of Addendum ***
Addendum:
FINDINGS: Mammographic images were obtained following MRI guided biopsy of
left breast enhancing nodule. Two-view mammography demonstrates
presence of dumbbell-shaped marker in the lower central left breast,
posterior depth. Expected post biopsy changes are noted.
IMPRESSION: Successful placement of dumbbell shaped marker post left breast MRI
guided core needle biopsy.

Final Assessment: Post Procedure Mammograms for Marker Placement

ADDENDUM:
For localization purposes, the clip is 2.5 centimeters LATERAL to
the area of biopsy.

ADDENDUM:
Correction: The dumbbell-shaped clip is in the expected location
following biopsy.

*** End of Addendum ***
Addendum:
FINDINGS: Mammographic images were obtained following MRI guided biopsy of
left breast enhancing nodule. Two-view mammography demonstrates
presence of dumbbell-shaped marker in the lower central left breast,
posterior depth. Expected post biopsy changes are noted.
IMPRESSION: Successful placement of dumbbell shaped marker post left breast MRI
guided core needle biopsy.

Final Assessment: Post Procedure Mammograms for Marker Placement

ADDENDUM:
For localization purposes, the clip is 2.5 centimeters LATERAL to
the area of biopsy.

ADDENDUM:
Correction: The dumbbell-shaped clip is in the expected location
following biopsy.

*** End of Addendum ***
Addendum:
FINDINGS: Mammographic images were obtained following MRI guided biopsy of
left breast enhancing nodule. Two-view mammography demonstrates
presence of dumbbell-shaped marker in the lower central left breast,
posterior depth. Expected post biopsy changes are noted.
IMPRESSION: Successful placement of dumbbell shaped marker post left breast MRI
guided core needle biopsy.

Final Assessment: Post Procedure Mammograms for Marker Placement

ADDENDUM:
For localization purposes, the clip is 2.5 centimeters LATERAL to
the area of biopsy.

ADDENDUM:
Correction: The dumbbell-shaped clip is in the expected location
following biopsy.

*** End of Addendum ***
Addendum:
FINDINGS: Mammographic images were obtained following MRI guided biopsy of
left breast enhancing nodule. Two-view mammography demonstrates
presence of dumbbell-shaped marker in the lower central left breast,
posterior depth. Expected post biopsy changes are noted.
IMPRESSION: Successful placement of dumbbell shaped marker post left breast MRI
guided core needle biopsy.

Final Assessment: Post Procedure Mammograms for Marker Placement

ADDENDUM:
For localization purposes, the clip is 2.5 centimeters LATERAL to
the area of biopsy.

ADDENDUM:
Correction: The dumbbell-shaped clip is in the expected location
following biopsy.

*** End of Addendum ***
Addendum:
FINDINGS: Mammographic images were obtained following MRI guided biopsy of
left breast enhancing nodule. Two-view mammography demonstrates
presence of dumbbell-shaped marker in the lower central left breast,
posterior depth. Expected post biopsy changes are noted.
IMPRESSION: Successful placement of dumbbell shaped marker post left breast MRI
guided core needle biopsy.

Final Assessment: Post Procedure Mammograms for Marker Placement

ADDENDUM:
For localization purposes, the clip is 2.5 centimeters LATERAL to
the area of biopsy.

ADDENDUM:
Correction: The dumbbell-shaped clip is in the expected location
following biopsy.

*** End of Addendum ***

## 2020-05-04 ENCOUNTER — Other Ambulatory Visit: Payer: Self-pay | Admitting: Obstetrics and Gynecology

## 2020-05-04 DIAGNOSIS — Z1231 Encounter for screening mammogram for malignant neoplasm of breast: Secondary | ICD-10-CM

## 2020-05-11 ENCOUNTER — Other Ambulatory Visit: Payer: Self-pay | Admitting: Obstetrics and Gynecology

## 2020-05-11 DIAGNOSIS — Z803 Family history of malignant neoplasm of breast: Secondary | ICD-10-CM

## 2020-05-25 ENCOUNTER — Ambulatory Visit (HOSPITAL_COMMUNITY): Payer: Managed Care, Other (non HMO) | Attending: Internal Medicine

## 2020-05-25 ENCOUNTER — Other Ambulatory Visit: Payer: Self-pay

## 2020-05-25 DIAGNOSIS — I351 Nonrheumatic aortic (valve) insufficiency: Secondary | ICD-10-CM | POA: Insufficient documentation

## 2020-05-25 LAB — ECHOCARDIOGRAM COMPLETE
Area-P 1/2: 4.06 cm2
P 1/2 time: 386 msec
S' Lateral: 2.3 cm

## 2020-06-11 NOTE — Progress Notes (Signed)
Cardiology Office Note   Date:  06/14/2020   ID:  Melissa Montoya, DOB 1956-03-14, MRN [id number]  PCP:  Sueanne Margarita, DO  Cardiologist:   Henryk Ursin Martin[place], MD   Chief Complaint  Patient presents with  . Follow-up    12 months.  . Aortic Insuffiency      History of Present Illness: Melissa Montoya is a 65 y.o. female who is seen at the request of Dr. Arvella Nigh for evaluation of cardiac murmur. She has a history of HLD. Was seen for routine physical and noted to have a heart murmur. This had never been mentioned before. She denies any chest pain, palpitations, dizziness, or dyspnea. No edema. She was sent for an Echo- done at [facility]. This reported to show normal LV size and function. Mild AV thickening and moderate to severe AI. No prior cardiac evaluation. On more recent Echo there was moderate AI. LV size, thickness and EF were all normal.   On follow up today she has no complaints. No dyspnea, chest pain, dizziness, palpitations, or edema. Feels very well. She did fall playing Pickleball last May and fractured her wrist. Had surgery.    Past Medical History:  Diagnosis Date  . Allergy   . Heart murmur   . Hyperlipidemia    no meds    Past Surgical History:  Procedure Laterality Date  . BREAST BIOPSY    . COLONOSCOPY       Current Outpatient Medications  Medication Sig Dispense Refill  . cetirizine (ZYRTEC) 10 MG tablet Take 10 mg by mouth daily.    Marland Kitchen terbinafine (LAMISIL) 250 MG tablet Take 250 mg by mouth daily as needed.    . triamcinolone cream (KENALOG) 0.1 % PRN    . valACYclovir HCl (VALTREX PO) Take by mouth. PRN for fever blister     No current facility-administered medications for this visit.    Allergies:   Patient has no known allergies.    Social History:  The patient  reports that she has never smoked. She has never used smokeless tobacco. She reports current alcohol use. She reports that she does not use drugs.   Family  History:  The patient's family history includes Breast cancer in her mother; Hyperlipidemia in her sister; Hypertension in her father; Lung cancer in her father.    ROS:  Please see the history of present illness.   Otherwise, review of systems are positive for none.   All other systems are reviewed and negative.    PHYSICAL EXAM: VS:  BP 114/78 (BP Location: Left Arm, Patient Position: Sitting, Cuff Size: Normal)   Pulse 65   Ht 5\' 6"  (1.676 m)   Wt 127 lb (57.6 kg)   BMI 20.50 kg/m  , BMI Body mass index is 20.5 kg/m. GEN: Well nourished, well developed, in no acute distress  HEENT: normal  Neck: no JVD, carotid bruits, or masses Cardiac: RRR; there is a very soft diastolic murmur RUSB 1/6, no rubs, or gallops,no edema. No lift Respiratory:  clear to auscultation bilaterally, normal work of breathing GI: soft, nontender, nondistended, + BS MS: no deformity or atrophy  Skin: warm and dry, no rash Neuro:  Strength and sensation are intact Psych: euthymic mood, full affect   EKG:  EKG is ordered today. The ekg ordered today demonstrates NSR with normal Ecg. Rate 65. I have personally reviewed and interpreted this study.    Recent Labs: No results found for requested labs  within last 8760 hours.    Lipid Panel No results found for: CHOL, TRIG, HDL, CHOLHDL, VLDL, LDLCALC, LDLDIRECT   Labs dated 08/10/15: cholesterol 246, triglycerides 105, HDL 90, ALT and TSH normal Dated 01/01/17: Hgb 11.7. BMET normal. Dated 10/11/17: A1c 5.2%. cholesterol 249, triglycerides 114, HDL 81, LDL 145. Kidney function normal. Dated 05/07/18: Hgb 12.3. CMET and TSH is normal.  Dated 06/03/19: cholesterol 247, triglycerides 75, HDL 82, LDL 150. CBC, CMET, TSH normal  Wt Readings from Last 3 Encounters:  06/14/20 127 lb (57.6 kg)  08/23/19 125 lb (56.7 kg)  08/15/19 125 lb 12.8 oz (57.1 kg)      Other studies Reviewed:  Echo 05/08/19:IMPRESSIONS    1. Left ventricular ejection fraction, by  visual estimation, is 60 to  65%. The left ventricle has normal function. There is no left ventricular  hypertrophy.  2. Left ventricular diastolic parameters are consistent with Grade I  diastolic dysfunction (impaired relaxation).  3. The left ventricle has no regional wall motion abnormalities.  4. Global right ventricle has normal systolic function.The right  ventricular size is normal. No increase in right ventricular wall  thickness.  5. Left atrial size was normal.  6. Right atrial size was normal.  7. The mitral valve is normal in structure. No evidence of mitral valve  regurgitation. No evidence of mitral stenosis.  8. The tricuspid valve is normal in structure.  9. The tricuspid valve is normal in structure. Tricuspid valve  regurgitation is not demonstrated.  10. Aortic valve regurgitation is moderate.  11. The aortic valve is normal in structure. Aortic valve regurgitation is  moderate. Mild to moderate aortic valve sclerosis/calcification without  any evidence of aortic stenosis.  12. The pulmonic valve was normal in structure. Pulmonic valve  regurgitation is not visualized.  13. The inferior vena cava is normal in size with greater than 50%  respiratory variability, suggesting right atrial pressure of 3 mmHg.  14. The average left ventricular global longitudinal strain is -21.1 %.    Echo 05/25/20: IMPRESSIONS    1. 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 The aortic valve is bicuspid. Aortic valve  regurgitation is mild to moderate (BP lower on today's study compared to  prior). No aortic stenosis is present.  2. 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  3. Normal ascending aorta dimensions where visualized. No definite  coarctation of the aorta.  4. Left ventricular ejection fraction by 3D volume is 69 %. The left  ventricle has normal function. The left  ventricle has no regional wall  motion abnormalities. Left ventricular diastolic parameters were normal.  The average left ventricular global  longitudinal strain is -23.7 %. The global longitudinal strain is normal.  5. Right ventricular systolic function is normal. The right ventricular  size is normal. Tricuspid regurgitation signal is inadequate for assessing  PA pressure.  6. The mitral valve is normal in structure. No evidence of mitral valve  regurgitation. No evidence of mitral stenosis.  7. The inferior vena cava is normal in size with greater than 50%  respiratory variability, suggesting right atrial pressure of 3 mmHg.   ASSESSMENT AND PLAN:  1.  Aortic insufficiency. Asymptomatic. Mild to Moderate by recent Echo. Probably bicuspid AV.  No stigmata of chronic AI. Ecg is normal. LV size and function normal. She is asymptomatic. At this point I have reassured her. Will plan repeat  Echo every other year with follow up office visit in one year.   2. 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   Current medicines are reviewed at length with the patient today.  The patient does not have concerns regarding medicines.  The following changes have been made:  no change  Labs/ tests ordered today include: No orders of the defined types were placed in this encounter.    Disposition:   FU with me  in 1 year  Signed, Jt Brabec Martinique, MD  06/14/2020 8:03 AM    [facility] [address] [place], [place], [address] Phone [phone], Fax [phone]

## 2020-06-14 ENCOUNTER — Other Ambulatory Visit: Payer: Self-pay

## 2020-06-14 ENCOUNTER — Encounter: Payer: Self-pay | Admitting: Cardiology

## 2020-06-14 ENCOUNTER — Ambulatory Visit: Payer: Managed Care, Other (non HMO) | Admitting: Cardiology

## 2020-06-14 VITALS — BP 114/78 | HR 65 | Ht 66.0 in | Wt 127.0 lb

## 2020-06-14 DIAGNOSIS — I351 Nonrheumatic aortic (valve) insufficiency: Secondary | ICD-10-CM

## 2020-06-17 ENCOUNTER — Other Ambulatory Visit: Payer: Self-pay

## 2020-06-17 ENCOUNTER — Ambulatory Visit
Admission: RE | Admit: 2020-06-17 | Discharge: 2020-06-17 | Disposition: A | Discharge status: Home / Self Care | Payer: Managed Care, Other (non HMO) | Source: Ambulatory Visit | Attending: Obstetrics and Gynecology | Admitting: Obstetrics and Gynecology

## 2020-06-17 DIAGNOSIS — Z1231 Encounter for screening mammogram for malignant neoplasm of breast: Secondary | ICD-10-CM

## 2020-06-18 ENCOUNTER — Other Ambulatory Visit: Payer: Self-pay | Admitting: Obstetrics and Gynecology

## 2020-06-18 DIAGNOSIS — R928 Other abnormal and inconclusive findings on diagnostic imaging of breast: Secondary | ICD-10-CM

## 2020-07-08 ENCOUNTER — Other Ambulatory Visit: Payer: Managed Care, Other (non HMO)

## 2020-07-12 ENCOUNTER — Other Ambulatory Visit: Payer: Managed Care, Other (non HMO)

## 2020-07-19 ENCOUNTER — Other Ambulatory Visit: Payer: Managed Care, Other (non HMO)

## 2020-07-20 ENCOUNTER — Ambulatory Visit
Admission: RE | Admit: 2020-07-20 | Discharge: 2020-07-20 | Disposition: A | Discharge status: Home / Self Care | Payer: Managed Care, Other (non HMO) | Source: Ambulatory Visit | Attending: Obstetrics and Gynecology | Admitting: Obstetrics and Gynecology

## 2020-07-20 ENCOUNTER — Other Ambulatory Visit: Payer: Self-pay

## 2020-07-20 DIAGNOSIS — R928 Other abnormal and inconclusive findings on diagnostic imaging of breast: Secondary | ICD-10-CM

## 2020-07-21 ENCOUNTER — Other Ambulatory Visit: Payer: Managed Care, Other (non HMO)

## 2020-07-27 ENCOUNTER — Ambulatory Visit
Admission: RE | Admit: 2020-07-27 | Discharge: 2020-07-27 | Disposition: A | Discharge status: Home / Self Care | Payer: Managed Care, Other (non HMO) | Source: Ambulatory Visit | Attending: Obstetrics and Gynecology | Admitting: Obstetrics and Gynecology

## 2020-07-27 DIAGNOSIS — Z803 Family history of malignant neoplasm of breast: Secondary | ICD-10-CM

## 2020-07-27 MED ORDER — GADOBUTROL 1 MMOL/ML IV SOLN
6.0000 mL | Freq: Once | INTRAVENOUS | Status: AC | PRN
  Administered 2020-07-27: 6 mL via INTRAVENOUS

## 2021-05-18 ENCOUNTER — Other Ambulatory Visit: Payer: Self-pay | Admitting: Obstetrics and Gynecology

## 2021-05-18 DIAGNOSIS — Z1231 Encounter for screening mammogram for malignant neoplasm of breast: Secondary | ICD-10-CM

## 2021-06-21 ENCOUNTER — Ambulatory Visit: Payer: Managed Care, Other (non HMO)

## 2021-06-21 ENCOUNTER — Ambulatory Visit
Admission: RE | Admit: 2021-06-21 | Discharge: 2021-06-21 | Disposition: A | Discharge status: Home / Self Care | Payer: Managed Care, Other (non HMO) | Source: Ambulatory Visit | Attending: Obstetrics and Gynecology | Admitting: Obstetrics and Gynecology

## 2021-06-21 DIAGNOSIS — Z1231 Encounter for screening mammogram for malignant neoplasm of breast: Secondary | ICD-10-CM

## 2021-07-28 ENCOUNTER — Ambulatory Visit: Payer: Managed Care, Other (non HMO) | Admitting: Cardiology

## 2021-09-12 NOTE — Progress Notes (Signed)
Cardiology Office Note   Date:  09/15/2021   ID:  Melissa Montoya, DOB Feb 24, 1956, MRN [id number]  PCP:  Melissa Margarita, DO  Cardiologist:   Melissa Bossi Martin[place], MD   Chief Complaint  Patient presents with   Follow-up    1 year.   Aortic Insuffiency      History of Present Illness: Melissa Montoya is a 66 y.o. female who is seen for follow up AI. She has a history of HLD. Was seen for routine physical and noted to have a heart murmur. She was sent for an Echo- done at [facility]. This reported to show normal LV size and function. Mild AV thickening and moderate to severe AI. No prior cardiac evaluation. On last  Echo in Feb 2022  there was moderate AI. LV size, thickness and EF were all normal.   On follow up today she has no complaints. No dyspnea, chest pain, dizziness, palpitations, or edema. Feels very well. She is active walking, doing elliptical and chasing her grandchild.    Past Medical History:  Diagnosis Date   Allergy    Heart murmur    Hyperlipidemia    no meds    Past Surgical History:  Procedure Laterality Date   BREAST BIOPSY     COLONOSCOPY       Current Outpatient Medications  Medication Sig Dispense Refill   cetirizine (ZYRTEC) 10 MG tablet Take 10 mg by mouth daily.     terbinafine (LAMISIL) 250 MG tablet Take 250 mg by mouth daily as needed.     triamcinolone cream (KENALOG) 0.1 % PRN     valACYclovir HCl (VALTREX PO) Take by mouth. PRN for fever blister     No current facility-administered medications for this visit.    Allergies:   Patient has no known allergies.    Social History:  The patient  reports that she has never smoked. She has never used smokeless tobacco. She reports current alcohol use. She reports that she does not use drugs.   Family History:  The patient's family history includes Breast cancer in her mother; Hyperlipidemia in her sister; Hypertension in her father; Lung cancer in her father.    ROS:  Please  see the history of present illness.   Otherwise, review of systems are positive for none.   All other systems are reviewed and negative.    PHYSICAL EXAM: VS:  BP 114/78 (BP Location: Left Arm, Patient Position: Sitting, Cuff Size: Normal)   Pulse 71   Ht 5' 6.5" (1.689 m)   Wt 125 lb (56.7 kg)   BMI 19.87 kg/m  , BMI Body mass index is 19.87 kg/m. GEN: Well nourished, well developed, in no acute distress  HEENT: normal  Neck: no JVD, carotid bruits, or masses Cardiac: RRR; there is a very soft diastolic murmur RUSB 1/6, no rubs, or gallops,no edema. No lift Respiratory:  clear to auscultation bilaterally, normal work of breathing GI: soft, nontender, nondistended, + BS MS: no deformity or atrophy  Skin: warm and dry, no rash Neuro:  Strength and sensation are intact Psych: euthymic mood, full affect   EKG:  EKG is ordered today. The ekg ordered today demonstrates ectopic atrial rhythm. Rate 71. Otherwise normal Ecg.  I have personally reviewed and interpreted this study.    Recent Labs: No results found for requested labs within last 365 days.    Lipid Panel No results found for: "CHOL", "TRIG", "HDL", "CHOLHDL", "VLDL", "LDLCALC", "LDLDIRECT"  Labs dated 08/10/15: cholesterol 246, triglycerides 105, HDL 90, ALT and TSH normal Dated 01/01/17: Hgb 11.7. BMET normal. Dated 10/11/17: A1c 5.2%. cholesterol 249, triglycerides 114, HDL 81, LDL 145. Kidney function normal. Dated 05/07/18: Hgb 12.3. CMET and TSH is normal.  Dated 06/03/19: cholesterol 247, triglycerides 75, HDL 82, LDL 150. CBC, CMET, TSH normal Dated 06/21/21: cholesterol 257, triglycerides 125, HDL 72, LDL 163. A1c 5.3%. CBC,CMET, and TSH normal.   Wt Readings from Last 3 Encounters:  09/15/21 125 lb (56.7 kg)  06/14/20 127 lb (57.6 kg)  08/23/19 125 lb (56.7 kg)      Other studies Reviewed:  Echo 05/08/19:IMPRESSIONS     1. Left ventricular ejection fraction, by visual estimation, is 60 to  65%. The left  ventricle has normal function. There is no left ventricular  hypertrophy.   2. Left ventricular diastolic parameters are consistent with Grade I  diastolic dysfunction (impaired relaxation).   3. The left ventricle has no regional wall motion abnormalities.   4. Global right ventricle has normal systolic function.The right  ventricular size is normal. No increase in right ventricular wall  thickness.   5. Left atrial size was normal.   6. Right atrial size was normal.   7. The mitral valve is normal in structure. No evidence of mitral valve  regurgitation. No evidence of mitral stenosis.   8. The tricuspid valve is normal in structure.   9. The tricuspid valve is normal in structure. Tricuspid valve  regurgitation is not demonstrated.  10. Aortic valve regurgitation is moderate.  11. The aortic valve is normal in structure. Aortic valve regurgitation is  moderate. Mild to moderate aortic valve sclerosis/calcification without  any evidence of aortic stenosis.  12. The pulmonic valve was normal in structure. Pulmonic valve  regurgitation is not visualized.  13. The inferior vena cava is normal in size with greater than 50%  respiratory variability, suggesting right atrial pressure of 3 mmHg.  14. The average left ventricular global longitudinal strain is -21.1 %.    Echo 05/25/20: IMPRESSIONS     1. 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 The aortic valve is bicuspid. Aortic valve  regurgitation is mild to moderate (BP lower on today's study compared to  prior). No aortic stenosis is present.   2. 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   3. Normal ascending aorta dimensions where visualized. No definite  coarctation of the aorta.   4. Left ventricular ejection fraction by 3D volume is 69 %. The left  ventricle has normal function. The left ventricle has no regional wall  motion  abnormalities. Left ventricular diastolic parameters were normal.  The average left ventricular global  longitudinal strain is -23.7 %. The global longitudinal strain is normal.   5. Right ventricular systolic function is normal. The right ventricular  size is normal. Tricuspid regurgitation signal is inadequate for assessing  PA pressure.   6. The mitral valve is normal in structure. No evidence of mitral valve  regurgitation. No evidence of mitral stenosis.   7. The inferior vena cava is normal in size with greater than 50%  respiratory variability, suggesting right atrial pressure of 3 mmHg.   ASSESSMENT AND PLAN:  1.  Aortic insufficiency. Asymptomatic.  Moderate by last Echo. Probably bicuspid AV.  No stigmata of chronic AI. LV size and function normal. She is asymptomatic. At this point I have  reassured her. Will plan repeat Echo every next year with follow up office visit  2. 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  3. Ectopic atrial rhythm. Asymptomatic. HR normal. Benign finding. No further evaluation needed.  4. Hypercholesterolemia. Discussed whether or not to treat with lipid lowering therapy. I think it would be very useful to do a coronary calcium score. If score is zero then we can defer lipid lowering therapy. If score is elevated then she should go on statin therapy. She is agreeable.    Current medicines are reviewed at length with the patient today.  The patient does not have concerns regarding medicines.  The following changes have been made:  no change  Labs/ tests ordered today include: No orders of the defined types were placed in this encounter.    Disposition:   FU with me  in 1 year  Signed, Petra Sargeant Martinique, MD  09/15/2021 4:11 PM    [facility] [address] [place], [place], [address] Phone [phone], Fax [phone]

## 2021-09-15 ENCOUNTER — Encounter: Payer: Self-pay | Admitting: Cardiology

## 2021-09-15 ENCOUNTER — Ambulatory Visit: Payer: Managed Care, Other (non HMO) | Admitting: Cardiology

## 2021-09-15 VITALS — BP 114/78 | HR 71 | Ht 66.5 in | Wt 125.0 lb

## 2021-09-15 DIAGNOSIS — Q231 Congenital insufficiency of aortic valve: Secondary | ICD-10-CM | POA: Diagnosis not present

## 2021-09-15 DIAGNOSIS — I351 Nonrheumatic aortic (valve) insufficiency: Secondary | ICD-10-CM

## 2021-09-15 DIAGNOSIS — E7800 Pure hypercholesterolemia, unspecified: Secondary | ICD-10-CM | POA: Diagnosis not present

## 2021-09-15 NOTE — Patient Instructions (Addendum)
Medication Instructions:    *If you need a refill on your cardiac medications before your next appointment, please call your pharmacy*   Lab Work:  Not needed    Testing/Procedures:   Will schedule for   Feb  2024 Your physician has requested that you have an echocardiogram. Echocardiography is a painless test that uses sound waves to create images of your heart. It provides your doctor with information about the size and shape of your heart and how well your heart's chambers and valves are working. This procedure takes approximately one hour. There are no restrictions for this procedure.  CT coronary calcium score.   Test locations:  [facility] ([address] [place], [place] [address]) [facility] ([address] [place], [place] [address])   This is $99 out of pocket.   Coronary CalciumScan A coronary calcium scan is an imaging test used to look for deposits of calcium and other fatty materials (plaques) in the inner lining of the blood vessels of the heart (coronary arteries). These deposits of calcium and plaques can partly clog and narrow the coronary arteries without producing any symptoms or warning signs. This puts a person at risk for a heart attack. This test can detect these deposits before symptoms develop. Tell a health care provider about: Any allergies you have. All medicines you are taking, including vitamins, herbs, eye drops, creams, and over-the-counter medicines. Any problems you or family members have had with anesthetic medicines. Any blood disorders you have. Any surgeries you have had. Any medical conditions you have. Whether you are pregnant or may be pregnant. What are the risks? Generally, this is a safe procedure. However, problems may occur, including: Harm to a pregnant woman and her unborn baby. This test involves the use of radiation. Radiation exposure can be dangerous to a pregnant woman and her unborn baby. If you are  pregnant, you generally should not have this procedure done. Slight increase in the risk of cancer. This is because of the radiation involved in the test. What happens before the procedure? No preparation is needed for this procedure. What happens during the procedure? You will undress and remove any jewelry around your neck or chest. You will put on a hospital gown. Sticky electrodes will be placed on your chest. The electrodes will be connected to an electrocardiogram (ECG) machine to record a tracing of the electrical activity of your heart. A CT scanner will take pictures of your heart. During this time, you will be asked to lie still and hold your breath for 2-3 seconds while a picture of your heart is being taken. The procedure may vary among health care providers and hospitals. What happens after the procedure? You can get dressed. You can return to your normal activities. It is up to you to get the results of your test. Ask your health care provider, or the department that is doing the test, when your results will be ready. Summary A coronary calcium scan is an imaging test used to look for deposits of calcium and other fatty materials (plaques) in the inner lining of the blood vessels of the heart (coronary arteries). Generally, this is a safe procedure. Tell your health care provider if you are pregnant or may be pregnant. No preparation is needed for this procedure. A CT scanner will take pictures of your heart. You can return to your normal activities after the scan is done. This information is not intended to replace advice given to you by your  health care provider. Make sure you discuss any questions you have with your health care provider. Document Released: 09/16/2007 Document Revised: 02/07/2016 Document Reviewed: 02/07/2016 Elsevier Interactive Patient Education  2017 [place]: At [facility], you and your health needs are our priority.  As part of  our continuing mission to provide you with exceptional heart care, we have created designated Provider Care Teams.  These Care Teams include your primary Cardiologist (physician) and Advanced Practice Providers (APPs -  Physician Assistants and Nurse Practitioners) who all work together to provide you with the care you need, when you need it.     Your next appointment:   12 month(s)  The format for your next appointment:   In Person  Provider:   Peter Martinique, MD

## 2021-09-26 ENCOUNTER — Encounter (HOSPITAL_BASED_OUTPATIENT_CLINIC_OR_DEPARTMENT_OTHER): Payer: Self-pay

## 2021-09-26 ENCOUNTER — Ambulatory Visit (HOSPITAL_BASED_OUTPATIENT_CLINIC_OR_DEPARTMENT_OTHER)
Admission: RE | Admit: 2021-09-26 | Discharge: 2021-09-26 | Disposition: A | Discharge status: Home / Self Care | Payer: Managed Care, Other (non HMO) | Source: Ambulatory Visit | Attending: Cardiology | Admitting: Cardiology

## 2021-09-26 DIAGNOSIS — Q2381 Bicuspid aortic valve: Secondary | ICD-10-CM

## 2021-09-26 DIAGNOSIS — E7800 Pure hypercholesterolemia, unspecified: Secondary | ICD-10-CM

## 2021-09-26 DIAGNOSIS — I351 Nonrheumatic aortic (valve) insufficiency: Secondary | ICD-10-CM

## 2021-09-26 DIAGNOSIS — Q231 Congenital insufficiency of aortic valve: Secondary | ICD-10-CM | POA: Insufficient documentation

## 2021-09-27 ENCOUNTER — Telehealth: Payer: Self-pay

## 2022-01-25 ENCOUNTER — Other Ambulatory Visit: Payer: Self-pay | Admitting: Obstetrics and Gynecology

## 2022-01-25 DIAGNOSIS — Z803 Family history of malignant neoplasm of breast: Secondary | ICD-10-CM

## 2022-02-14 ENCOUNTER — Ambulatory Visit
Admission: RE | Admit: 2022-02-14 | Discharge: 2022-02-14 | Disposition: A | Discharge status: Home / Self Care | Payer: Managed Care, Other (non HMO) | Source: Ambulatory Visit | Attending: Obstetrics and Gynecology | Admitting: Obstetrics and Gynecology

## 2022-02-14 DIAGNOSIS — Z803 Family history of malignant neoplasm of breast: Secondary | ICD-10-CM

## 2022-02-14 MED ORDER — GADOPICLENOL 0.5 MMOL/ML IV SOLN
6.0000 mL | Freq: Once | INTRAVENOUS | Status: AC | PRN
  Administered 2022-02-14: 6 mL via INTRAVENOUS

## 2022-05-29 ENCOUNTER — Ambulatory Visit (HOSPITAL_COMMUNITY): Payer: Managed Care, Other (non HMO) | Attending: Cardiology

## 2022-05-29 DIAGNOSIS — I351 Nonrheumatic aortic (valve) insufficiency: Secondary | ICD-10-CM | POA: Insufficient documentation

## 2022-05-29 DIAGNOSIS — Q231 Congenital insufficiency of aortic valve: Secondary | ICD-10-CM | POA: Diagnosis not present

## 2022-05-29 LAB — ECHOCARDIOGRAM COMPLETE
Area-P 1/2: 4.93 cm2
P 1/2 time: 483 msec
S' Lateral: 2.3 cm

## 2022-07-24 ENCOUNTER — Other Ambulatory Visit: Payer: Self-pay | Admitting: Obstetrics and Gynecology

## 2022-07-24 DIAGNOSIS — Z1231 Encounter for screening mammogram for malignant neoplasm of breast: Secondary | ICD-10-CM

## 2022-07-28 ENCOUNTER — Ambulatory Visit
Admission: RE | Admit: 2022-07-28 | Discharge: 2022-07-28 | Disposition: A | Discharge status: Home / Self Care | Payer: Managed Care, Other (non HMO) | Source: Ambulatory Visit | Attending: Obstetrics and Gynecology | Admitting: Obstetrics and Gynecology

## 2022-07-28 DIAGNOSIS — Z1231 Encounter for screening mammogram for malignant neoplasm of breast: Secondary | ICD-10-CM

## 2022-10-06 NOTE — Progress Notes (Unsigned)
Cardiology Office Note   Date:  09/15/2021   ID:  Melissa Montoya, DOB 1955/11/05, MRN [id number]  PCP:  Charlane Ferretti, DO  Cardiologist:   Hazel Leveille [place], MD   Chief Complaint  Patient presents with   Follow-up    1 year.   Aortic Insuffiency      History of Present Illness: Melissa Montoya is a 67 y.o. female who is seen for follow up AI. She has a history of HLD. Was seen for routine physical and noted to have a heart murmur. She was sent for an Echo- done at [facility]. This reported to show normal LV size and function. Mild AV thickening and moderate to severe AI. No prior cardiac evaluation. On last  Echo in Feb 2024  there was mild to moderate AI. LV size, thickness and EF were all normal. No change..   On follow up today she has no complaints. No dyspnea, chest pain, dizziness, palpitations, or edema. Feels very well. She is active walking, doing elliptical and chasing her grandchild.    Past Medical History:  Diagnosis Date   Allergy    Heart murmur    Hyperlipidemia    no meds    Past Surgical History:  Procedure Laterality Date   BREAST BIOPSY     COLONOSCOPY       Current Outpatient Medications  Medication Sig Dispense Refill   cetirizine (ZYRTEC) 10 MG tablet Take 10 mg by mouth daily.     terbinafine (LAMISIL) 250 MG tablet Take 250 mg by mouth daily as needed.     triamcinolone cream (KENALOG) 0.1 % PRN     valACYclovir HCl (VALTREX PO) Take by mouth. PRN for fever blister     No current facility-administered medications for this visit.    Allergies:   Patient has no known allergies.    Social History:  The patient  reports that she has never smoked. She has never used smokeless tobacco. She reports current alcohol use. She reports that she does not use drugs.   Family History:  The patient's family history includes Breast cancer in her mother; Hyperlipidemia in her sister; Hypertension in her father; Lung cancer in her  father.    ROS:  Please see the history of present illness.   Otherwise, review of systems are positive for none.   All other systems are reviewed and negative.    PHYSICAL EXAM: VS:  BP 114/78 (BP Location: Left Arm, Patient Position: Sitting, Cuff Size: Normal)   Pulse 71   Ht 5' 6.5" (1.689 m)   Wt 125 lb (56.7 kg)   BMI 19.87 kg/m  , BMI Body mass index is 19.87 kg/m. GEN: Well nourished, well developed, in no acute distress  HEENT: normal  Neck: no JVD, carotid bruits, or masses Cardiac: RRR; there is a very soft diastolic murmur RUSB 1/6, no rubs, or gallops,no edema. No lift Respiratory:  clear to auscultation bilaterally, normal work of breathing GI: soft, nontender, nondistended, + BS MS: no deformity or atrophy  Skin: warm and dry, no rash Neuro:  Strength and sensation are intact Psych: euthymic mood, full affect   EKG:  EKG is ordered today. The ekg ordered today demonstrates ectopic atrial rhythm. Rate 71. Otherwise normal Ecg.  I have personally reviewed and interpreted this study.    Recent Labs: No results found for requested labs within last 365 days.    Lipid Panel No results found for: "CHOL", "TRIG", "HDL", "CHOLHDL", "  VLDL", "LDLCALC", "LDLDIRECT"   Labs dated 08/10/15: cholesterol 246, triglycerides 105, HDL 90, ALT and TSH normal Dated 01/01/17: Hgb 11.7. BMET normal. Dated 10/11/17: A1c 5.2%. cholesterol 249, triglycerides 114, HDL 81, LDL 145. Kidney function normal. Dated 05/07/18: Hgb 12.3. CMET and TSH is normal.  Dated 06/03/19: cholesterol 247, triglycerides 75, HDL 82, LDL 150. CBC, CMET, TSH normal Dated 06/21/21: cholesterol 257, triglycerides 125, HDL 72, LDL 163. A1c 5.3%. CBC,CMET, and TSH normal.  Dated 06/07/22: cholesterol 224, triglycerides 62, HDL 75, CMET and CBC normal  Wt Readings from Last 3 Encounters:  09/15/21 125 lb (56.7 kg)  06/14/20 127 lb (57.6 kg)  08/23/19 125 lb (56.7 kg)      Other studies Reviewed:  Echo  05/08/19:IMPRESSIONS     1. Left ventricular ejection fraction, by visual estimation, is 60 to  65%. The left ventricle has normal function. There is no left ventricular  hypertrophy.   2. Left ventricular diastolic parameters are consistent with Grade I  diastolic dysfunction (impaired relaxation).   3. The left ventricle has no regional wall motion abnormalities.   4. Global right ventricle has normal systolic function.The right  ventricular size is normal. No increase in right ventricular wall  thickness.   5. Left atrial size was normal.   6. Right atrial size was normal.   7. The mitral valve is normal in structure. No evidence of mitral valve  regurgitation. No evidence of mitral stenosis.   8. The tricuspid valve is normal in structure.   9. The tricuspid valve is normal in structure. Tricuspid valve  regurgitation is not demonstrated.  10. Aortic valve regurgitation is moderate.  11. The aortic valve is normal in structure. Aortic valve regurgitation is  moderate. Mild to moderate aortic valve sclerosis/calcification without  any evidence of aortic stenosis.  12. The pulmonic valve was normal in structure. Pulmonic valve  regurgitation is not visualized.  13. The inferior vena cava is normal in size with greater than 50%  respiratory variability, suggesting right atrial pressure of 3 mmHg.  14. The average left ventricular global longitudinal strain is -21.1 %.    Echo 05/25/20: IMPRESSIONS     1. 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 The aortic valve is bicuspid. Aortic valve  regurgitation is mild to moderate (BP lower on today's study compared to  prior). No aortic stenosis is present.   2. 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   3. Normal ascending aorta dimensions where visualized. No definite  coarctation of the aorta.   4. Left ventricular ejection fraction by  3D volume is 69 %. The left  ventricle has normal function. The left ventricle has no regional wall  motion abnormalities. Left ventricular diastolic parameters were normal.  The average left ventricular global  longitudinal strain is -23.7 %. The global longitudinal strain is normal.   5. Right ventricular systolic function is normal. The right ventricular  size is normal. Tricuspid regurgitation signal is inadequate for assessing  PA pressure.   6. The mitral valve is normal in structure. No evidence of mitral valve  regurgitation. No evidence of mitral stenosis.   7. The inferior vena cava is normal in size with greater than 50%  respiratory variability, suggesting right atrial pressure of 3 mmHg.   Echo 05/29/22: IMPRESSIONS     1. Left ventricular ejection fraction, by estimation, is 60 to 65%. The  left ventricle has normal function. The left ventricle has no regional  wall motion abnormalities. Left ventricular diastolic parameters were  normal. The average left ventricular  global longitudinal strain is -22.5 %. The global longitudinal strain is  normal. I do not see a definite perimembranous VSD on this study but  cannot fully rule out as the area was not extensively scanned.   2. Right ventricular systolic function is normal. The right ventricular  size is normal. There is normal pulmonary artery systolic pressure. The  estimated right ventricular systolic pressure is 26.4 mmHg.   3. The mitral valve is normal in structure. No evidence of mitral valve  regurgitation. No evidence of mitral stenosis.   4. Fused raphe left and right cusps. The aortic valve is bicuspid. Aortic  valve regurgitation is mild. No aortic stenosis is present.   5. The inferior vena cava is normal in size with greater than 50%  respiratory variability, suggesting right atrial pressure of 3 mmHg.   ASSESSMENT AND PLAN:  1.  Aortic insufficiency. Asymptomatic.  Moderate by last Echo. Probably bicuspid  AV.  No stigmata of chronic AI. LV size and function normal. She is asymptomatic. At this point I have reassured her. Will plan repeat Echo every next year with follow up office visit  2. 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  3. Ectopic atrial rhythm. Asymptomatic. HR normal. Benign finding. No further evaluation needed.  4. Hypercholesterolemia. Discussed whether or not to treat with lipid lowering therapy. I think it would be very useful to do a coronary calcium score. If score is zero then we can defer lipid lowering therapy. If score is elevated then she should go on statin therapy. She is agreeable.    Current medicines are reviewed at length with the patient today.  The patient does not have concerns regarding medicines.  The following changes have been made:  no change  Labs/ tests ordered today include: No orders of the defined types were placed in this encounter.    Disposition:   FU with me  in 1 year  Signed, Itha Kroeker [place], MD  09/15/2021 4:11 PM    [facility] [address], [place], [place], [address] Phone [phone], Fax [phone]

## 2022-10-12 ENCOUNTER — Encounter: Payer: Self-pay | Admitting: Cardiology

## 2022-10-12 ENCOUNTER — Ambulatory Visit: Payer: Managed Care, Other (non HMO) | Attending: Cardiology | Admitting: Cardiology

## 2022-10-12 VITALS — BP 116/70 | HR 70 | Ht 66.5 in | Wt 124.0 lb

## 2022-10-12 DIAGNOSIS — I351 Nonrheumatic aortic (valve) insufficiency: Secondary | ICD-10-CM

## 2022-10-12 DIAGNOSIS — E7800 Pure hypercholesterolemia, unspecified: Secondary | ICD-10-CM | POA: Diagnosis not present

## 2022-10-12 DIAGNOSIS — Q231 Congenital insufficiency of aortic valve: Secondary | ICD-10-CM

## 2022-10-12 NOTE — Patient Instructions (Signed)
Medication Instructions:  Continue same medications *If you need a refill on your cardiac medications before your next appointment, please call your pharmacy*   Lab Work: None ordered   Testing/Procedures: None ordered   Follow-Up: At [facility], you and your health needs are our priority.  As part of our continuing mission to provide you with exceptional heart care, we have created designated Provider Care Teams.  These Care Teams include your primary Cardiologist (physician) and Advanced Practice Providers (APPs -  Physician Assistants and Nurse Practitioners) who all work together to provide you with the care you need, when you need it.  We recommend signing up for the patient portal called "MyChart".  Sign up information is provided on this After Visit Summary.  MyChart is used to connect with patients for Virtual Visits (Telemedicine).  Patients are able to view lab/test results, encounter notes, upcoming appointments, etc.  Non-urgent messages can be sent to your provider as well.   To learn more about what you can do with MyChart, go to [email or web address].    Your next appointment:  1 year   Call in March to schedule July appointment     Provider:  Dr.Jordan   

## 2023-01-11 ENCOUNTER — Other Ambulatory Visit: Payer: Self-pay | Admitting: Obstetrics and Gynecology

## 2023-01-11 DIAGNOSIS — Z803 Family history of malignant neoplasm of breast: Secondary | ICD-10-CM

## 2023-02-27 ENCOUNTER — Ambulatory Visit
Admission: RE | Admit: 2023-02-27 | Discharge: 2023-02-27 | Disposition: A | Discharge status: Home / Self Care | Payer: Managed Care, Other (non HMO) | Source: Ambulatory Visit | Attending: Obstetrics and Gynecology | Admitting: Obstetrics and Gynecology

## 2023-02-27 DIAGNOSIS — Z803 Family history of malignant neoplasm of breast: Secondary | ICD-10-CM

## 2023-02-27 MED ORDER — GADOPICLENOL 0.5 MMOL/ML IV SOLN
6.0000 mL | Freq: Once | INTRAVENOUS | Status: AC | PRN
  Administered 2023-02-27: 6 mL via INTRAVENOUS

## 2023-06-26 ENCOUNTER — Other Ambulatory Visit: Payer: Self-pay | Admitting: Internal Medicine

## 2023-06-26 DIAGNOSIS — Z1231 Encounter for screening mammogram for malignant neoplasm of breast: Secondary | ICD-10-CM

## 2023-08-02 ENCOUNTER — Ambulatory Visit
Admission: RE | Admit: 2023-08-02 | Discharge: 2023-08-02 | Disposition: A | Discharge status: Home / Self Care | Source: Ambulatory Visit | Attending: Internal Medicine | Admitting: Internal Medicine

## 2023-08-02 DIAGNOSIS — Z1231 Encounter for screening mammogram for malignant neoplasm of breast: Secondary | ICD-10-CM

## 2023-10-08 NOTE — Progress Notes (Signed)
 Cardiology Office Note   Date:  10/08/2023   ID:  Melissa Montoya, DOB 1956/02/08, MRN [id number]  PCP:  Valentin Skates, DO  Cardiologist:   Keron Koffman [place], MD   No chief complaint on file.     History of Present Illness: Melissa Montoya is a 68 y.o. female who is seen for follow up AI. She has a history of HLD. Was seen for routine physical and noted to have a heart murmur. She was sent for an Echo- done at [facility]. This reported to show normal LV size and function. Mild AV thickening and moderate to severe AI. No prior cardiac evaluation. On last  Echo in Feb 2024  there was mild to moderate AI. LV size, thickness and EF were all normal. No change. Older studies suggested possible small peri membranous VSD but this was not seen on latest study. She did have a coronary calcium score a year ago showing a score of 0.   On follow up today she has no complaints. No dyspnea, chest pain, dizziness, palpitations, or edema. Feels very well. She is active walking.     Past Medical History:  Diagnosis Date   Allergy    Heart murmur    Hyperlipidemia    no meds    Past Surgical History:  Procedure Laterality Date   BREAST BIOPSY Left 04/01/2018   COLONOSCOPY       Current Outpatient Medications  Medication Sig Dispense Refill   cetirizine (ZYRTEC) 10 MG tablet Take 10 mg by mouth daily.     triamcinolone cream (KENALOG) 0.1 % PRN     valACYclovir HCl (VALTREX PO) Take by mouth. PRN for fever blister     No current facility-administered medications for this visit.    Allergies:   Patient has no known allergies.    Social History:  The patient  reports that she has never smoked. She has never used smokeless tobacco. She reports current alcohol use. She reports that she does not use drugs.   Family History:  The patient's family history includes Breast cancer in her mother; Hyperlipidemia in her sister; Hypertension in her father; Lung cancer in her father.     ROS:  Please see the history of present illness.   Otherwise, review of systems are positive for none.   All other systems are reviewed and negative.    PHYSICAL EXAM: VS:  There were no vitals taken for this visit. , BMI There is no height or weight on file to calculate BMI. GEN: Well nourished, well developed, in no acute distress  HEENT: normal  Neck: no JVD, carotid bruits, or masses Cardiac: RRR; there is a very soft diastolic murmur RUSB 1/6, no rubs, or gallops,no edema. No lift Respiratory:  clear to auscultation bilaterally, normal work of breathing GI: soft, nontender, nondistended, + BS MS: no deformity or atrophy  Skin: warm and dry, no rash Neuro:  Strength and sensation are intact Psych: euthymic mood, full affect          Recent Labs: No results found for requested labs within last 365 days.    Lipid Panel No results found for: CHOL, TRIG, HDL, CHOLHDL, VLDL, LDLCALC, LDLDIRECT   Labs dated 08/10/15: cholesterol 246, triglycerides 105, HDL 90, ALT and TSH normal Dated 01/01/17: Hgb 11.7. BMET normal. Dated 10/11/17: A1c 5.2%. cholesterol 249, triglycerides 114, HDL 81, LDL 145. Kidney function normal. Dated 05/07/18: Hgb 12.3. CMET and TSH is normal.  Dated 06/03/19: cholesterol  247, triglycerides 75, HDL 82, LDL 150. CBC, CMET, TSH normal Dated 06/21/21: cholesterol 257, triglycerides 125, HDL 72, LDL 163. A1c 5.3%. CBC,CMET, and TSH normal.  Dated 06/07/22: cholesterol 224, triglycerides 62, HDL 75, CMET and CBC normal  Wt Readings from Last 3 Encounters:  10/12/22 124 lb (56.2 kg)  09/15/21 125 lb (56.7 kg)  06/14/20 127 lb (57.6 kg)    EKG Interpretation Date/Time:  Friday October 12 2023 10:12:35 EDT Ventricular Rate:  65 PR Interval:  172 QRS Duration:  72 QT Interval:  416 QTC Calculation: 432 R Axis:   62  Text Interpretation: Normal sinus rhythm Normal ECG When compared with ECG of 12-Oct-2022 07:59, No significant change was found  Confirmed by [place], Brockton Mckesson [id number]) on 10/12/2023 10:18:50 AM    Other studies Reviewed:  Echo 05/08/19:IMPRESSIONS     1. Left ventricular ejection fraction, by visual estimation, is 60 to  65%. The left ventricle has normal function. There is no left ventricular  hypertrophy.   2. Left ventricular diastolic parameters are consistent with Grade I  diastolic dysfunction (impaired relaxation).   3. The left ventricle has no regional wall motion abnormalities.   4. Global right ventricle has normal systolic function.The right  ventricular size is normal. No increase in right ventricular wall  thickness.   5. Left atrial size was normal.   6. Right atrial size was normal.   7. The mitral valve is normal in structure. No evidence of mitral valve  regurgitation. No evidence of mitral stenosis.   8. The tricuspid valve is normal in structure.   9. The tricuspid valve is normal in structure. Tricuspid valve  regurgitation is not demonstrated.  10. Aortic valve regurgitation is moderate.  11. The aortic valve is normal in structure. Aortic valve regurgitation is  moderate. Mild to moderate aortic valve sclerosis/calcification without  any evidence of aortic stenosis.  12. The pulmonic valve was normal in structure. Pulmonic valve  regurgitation is not visualized.  13. The inferior vena cava is normal in size with greater than 50%  respiratory variability, suggesting right atrial pressure of 3 mmHg.  14. The average left ventricular global longitudinal strain is -21.1 %.    Echo 05/25/20: IMPRESSIONS     1. 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 The aortic valve is bicuspid. Aortic valve  regurgitation is mild to moderate (BP lower on today's study compared to  prior). No aortic stenosis is present.   2. 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   3. Normal ascending aorta dimensions where  visualized. No definite  coarctation of the aorta.   4. Left ventricular ejection fraction by 3D volume is 69 %. The left  ventricle has normal function. The left ventricle has no regional wall  motion abnormalities. Left ventricular diastolic parameters were normal.  The average left ventricular global  longitudinal strain is -23.7 %. The global longitudinal strain is normal.   5. Right ventricular systolic function is normal. The right ventricular  size is normal. Tricuspid regurgitation signal is inadequate for assessing  PA pressure.   6. The mitral valve is normal in structure. No evidence of mitral valve  regurgitation. No evidence of mitral stenosis.   7. The inferior vena cava is normal in size with greater than 50%  respiratory variability, suggesting right atrial pressure of 3 mmHg.   Echo 05/29/22: IMPRESSIONS  1. Left ventricular ejection fraction, by estimation, is 60 to 65%. The  left ventricle has normal function. The left ventricle has no regional  wall motion abnormalities. Left ventricular diastolic parameters were  normal. The average left ventricular  global longitudinal strain is -22.5 %. The global longitudinal strain is  normal. I do not see a definite perimembranous VSD on this study but  cannot fully rule out as the area was not extensively scanned.   2. Right ventricular systolic function is normal. The right ventricular  size is normal. There is normal pulmonary artery systolic pressure. The  estimated right ventricular systolic pressure is 26.4 mmHg.   3. The mitral valve is normal in structure. No evidence of mitral valve  regurgitation. No evidence of mitral stenosis.   4. Fused raphe left and right cusps. The aortic valve is bicuspid. Aortic  valve regurgitation is mild. No aortic stenosis is present.   5. The inferior vena cava is normal in size with greater than 50%  respiratory variability, suggesting right atrial pressure of 3 mmHg.   ASSESSMENT  AND PLAN:  1.  Aortic insufficiency. Asymptomatic.  Mild  by last Echo. Probably bicuspid AV.  No stigmata of chronic AI. LV size and function normal. At this point I have reassured her. Will plan repeat Echo every 3-4 years with follow up office visit in one year  2. Small membranous VSD noted on prior Echo but not seen on most recent study. No significant shunt.  3.  Hypercholesterolemia. Coronary calcium score of 0 indicates a low CV risk. Recommend lifestyle modification   Current medicines are reviewed at length with the patient today.  The patient does not have concerns regarding medicines.  The following changes have been made:  no change  Labs/ tests ordered today include: No orders of the defined types were placed in this encounter.    Disposition:   FU with me  in 1 year  Signed, Allean Montfort [place], MD  10/08/2023 7:35 AM    [facility] [address], [place], [place], [address] Phone [phone], Fax [phone]

## 2023-10-12 ENCOUNTER — Ambulatory Visit: Attending: Cardiology | Admitting: Cardiology

## 2023-10-12 ENCOUNTER — Encounter: Payer: Self-pay | Admitting: Cardiology

## 2023-10-12 VITALS — BP 134/74 | HR 76 | Ht 66.5 in | Wt 125.8 lb

## 2023-10-12 DIAGNOSIS — I351 Nonrheumatic aortic (valve) insufficiency: Secondary | ICD-10-CM

## 2023-10-12 DIAGNOSIS — Q2381 Bicuspid aortic valve: Secondary | ICD-10-CM | POA: Diagnosis not present

## 2023-10-12 NOTE — Patient Instructions (Signed)

## 2024-01-04 ENCOUNTER — Other Ambulatory Visit: Payer: Self-pay | Admitting: Obstetrics and Gynecology

## 2024-01-04 DIAGNOSIS — Z9189 Other specified personal risk factors, not elsewhere classified: Secondary | ICD-10-CM

## 2024-03-03 ENCOUNTER — Ambulatory Visit
Admission: RE | Admit: 2024-03-03 | Discharge: 2024-03-03 | Disposition: A | Source: Ambulatory Visit | Attending: Obstetrics and Gynecology | Admitting: Obstetrics and Gynecology

## 2024-03-03 DIAGNOSIS — Z9189 Other specified personal risk factors, not elsewhere classified: Secondary | ICD-10-CM

## 2024-03-03 MED ORDER — GADOPICLENOL 0.5 MMOL/ML IV SOLN
6.0000 mL | Freq: Once | INTRAVENOUS | Status: AC | PRN
  Administered 2024-03-03: 6 mL via INTRAVENOUS
